# Patient Record
Sex: Female | Born: 1980 | Race: Black or African American | Hispanic: No | Marital: Single | Smoking: Former smoker
Health system: Southern US, Community
[De-identification: ages and names within clinical notes are randomized; demographics above are authoritative.]

## PROBLEM LIST (undated history)

## (undated) ENCOUNTER — Inpatient Hospital Stay (HOSPITAL_COMMUNITY): Payer: Self-pay

## (undated) DIAGNOSIS — E669 Obesity, unspecified: Secondary | ICD-10-CM

## (undated) DIAGNOSIS — M1990 Unspecified osteoarthritis, unspecified site: Secondary | ICD-10-CM

## (undated) DIAGNOSIS — A749 Chlamydial infection, unspecified: Secondary | ICD-10-CM

## (undated) DIAGNOSIS — A599 Trichomoniasis, unspecified: Secondary | ICD-10-CM

## (undated) DIAGNOSIS — D72829 Elevated white blood cell count, unspecified: Principal | ICD-10-CM

## (undated) HISTORY — PX: HIP SURGERY: SHX245

## (undated) HISTORY — PX: TUBAL LIGATION: SHX77

## (undated) HISTORY — PX: JOINT REPLACEMENT: SHX530

## (undated) HISTORY — DX: Elevated white blood cell count, unspecified: D72.829

---
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
## 1998-01-13 ENCOUNTER — Inpatient Hospital Stay (HOSPITAL_COMMUNITY): Admission: AD | Admit: 1998-01-13 | Discharge: 1998-01-15 | Payer: Self-pay | Admitting: Obstetrics

## 1998-03-18 HISTORY — PX: HIP SURGERY: SHX245

## 1998-12-06 ENCOUNTER — Inpatient Hospital Stay (HOSPITAL_COMMUNITY): Admission: AD | Admit: 1998-12-06 | Discharge: 1998-12-06 | Payer: Self-pay | Admitting: Obstetrics

## 1999-04-27 ENCOUNTER — Inpatient Hospital Stay (HOSPITAL_COMMUNITY): Admission: AD | Admit: 1999-04-27 | Discharge: 1999-04-27 | Payer: Self-pay | Admitting: *Deleted

## 1999-07-12 ENCOUNTER — Observation Stay (HOSPITAL_COMMUNITY): Admission: RE | Admit: 1999-07-12 | Discharge: 1999-07-13 | Payer: Self-pay | Admitting: *Deleted

## 1999-08-02 ENCOUNTER — Encounter: Admission: RE | Admit: 1999-08-02 | Discharge: 1999-08-02 | Payer: Self-pay | Admitting: Obstetrics

## 1999-08-16 ENCOUNTER — Encounter: Admission: RE | Admit: 1999-08-16 | Discharge: 1999-08-16 | Payer: Self-pay | Admitting: Obstetrics

## 1999-08-30 ENCOUNTER — Inpatient Hospital Stay (HOSPITAL_COMMUNITY): Admission: AD | Admit: 1999-08-30 | Discharge: 1999-08-30 | Payer: Self-pay | Admitting: *Deleted

## 1999-08-30 ENCOUNTER — Encounter: Admission: RE | Admit: 1999-08-30 | Discharge: 1999-08-30 | Payer: Self-pay | Admitting: Obstetrics

## 1999-09-06 ENCOUNTER — Encounter: Admission: RE | Admit: 1999-09-06 | Discharge: 1999-09-06 | Payer: Self-pay | Admitting: Obstetrics

## 1999-09-13 ENCOUNTER — Encounter: Admission: RE | Admit: 1999-09-13 | Discharge: 1999-09-13 | Payer: Self-pay | Admitting: Obstetrics

## 1999-09-15 ENCOUNTER — Inpatient Hospital Stay (HOSPITAL_COMMUNITY): Admission: AD | Admit: 1999-09-15 | Discharge: 1999-09-17 | Payer: Self-pay | Admitting: *Deleted

## 2001-09-08 ENCOUNTER — Emergency Department (HOSPITAL_COMMUNITY): Admission: EM | Admit: 2001-09-08 | Discharge: 2001-09-08 | Payer: Self-pay | Admitting: Emergency Medicine

## 2002-02-15 ENCOUNTER — Inpatient Hospital Stay (HOSPITAL_COMMUNITY): Admission: AD | Admit: 2002-02-15 | Discharge: 2002-02-15 | Payer: Self-pay | Admitting: *Deleted

## 2002-03-04 ENCOUNTER — Ambulatory Visit (HOSPITAL_COMMUNITY): Admission: RE | Admit: 2002-03-04 | Discharge: 2002-03-04 | Payer: Self-pay | Admitting: *Deleted

## 2002-04-20 ENCOUNTER — Ambulatory Visit (HOSPITAL_COMMUNITY): Admission: RE | Admit: 2002-04-20 | Discharge: 2002-04-20 | Payer: Self-pay | Admitting: *Deleted

## 2002-04-20 ENCOUNTER — Encounter: Payer: Self-pay | Admitting: *Deleted

## 2002-04-26 ENCOUNTER — Encounter: Payer: Self-pay | Admitting: *Deleted

## 2002-04-26 ENCOUNTER — Ambulatory Visit (HOSPITAL_COMMUNITY): Admission: RE | Admit: 2002-04-26 | Discharge: 2002-04-26 | Payer: Self-pay | Admitting: *Deleted

## 2002-07-06 ENCOUNTER — Ambulatory Visit (HOSPITAL_COMMUNITY): Admission: RE | Admit: 2002-07-06 | Discharge: 2002-07-06 | Payer: Self-pay | Admitting: *Deleted

## 2002-07-17 ENCOUNTER — Inpatient Hospital Stay (HOSPITAL_COMMUNITY): Admission: AD | Admit: 2002-07-17 | Discharge: 2002-07-17 | Payer: Self-pay | Admitting: *Deleted

## 2002-08-17 ENCOUNTER — Inpatient Hospital Stay (HOSPITAL_COMMUNITY): Admission: AD | Admit: 2002-08-17 | Discharge: 2002-08-20 | Payer: Self-pay | Admitting: *Deleted

## 2002-08-17 ENCOUNTER — Encounter (INDEPENDENT_AMBULATORY_CARE_PROVIDER_SITE_OTHER): Payer: Self-pay

## 2002-10-12 ENCOUNTER — Emergency Department (HOSPITAL_COMMUNITY): Admission: EM | Admit: 2002-10-12 | Discharge: 2002-10-12 | Payer: Self-pay | Admitting: Emergency Medicine

## 2003-02-02 ENCOUNTER — Emergency Department (HOSPITAL_COMMUNITY): Admission: EM | Admit: 2003-02-02 | Discharge: 2003-02-02 | Payer: Self-pay | Admitting: Emergency Medicine

## 2003-04-25 ENCOUNTER — Inpatient Hospital Stay (HOSPITAL_COMMUNITY): Admission: RE | Admit: 2003-04-25 | Discharge: 2003-04-29 | Payer: Self-pay | Admitting: Orthopedic Surgery

## 2003-05-02 ENCOUNTER — Ambulatory Visit (HOSPITAL_COMMUNITY): Admission: RE | Admit: 2003-05-02 | Discharge: 2003-05-02 | Payer: Self-pay

## 2003-05-23 ENCOUNTER — Encounter: Admission: RE | Admit: 2003-05-23 | Discharge: 2003-08-01 | Payer: Self-pay | Admitting: Orthopedic Surgery

## 2003-11-29 ENCOUNTER — Emergency Department (HOSPITAL_COMMUNITY): Admission: EM | Admit: 2003-11-29 | Discharge: 2003-11-29 | Payer: Self-pay | Admitting: *Deleted

## 2004-07-07 ENCOUNTER — Emergency Department (HOSPITAL_COMMUNITY): Admission: EM | Admit: 2004-07-07 | Discharge: 2004-07-07 | Payer: Self-pay | Admitting: *Deleted

## 2004-07-12 ENCOUNTER — Inpatient Hospital Stay (HOSPITAL_COMMUNITY): Admission: AD | Admit: 2004-07-12 | Discharge: 2004-07-12 | Payer: Self-pay | Admitting: Obstetrics & Gynecology

## 2005-05-21 ENCOUNTER — Emergency Department (HOSPITAL_COMMUNITY): Admission: EM | Admit: 2005-05-21 | Discharge: 2005-05-21 | Payer: Self-pay | Admitting: Family Medicine

## 2005-07-31 ENCOUNTER — Inpatient Hospital Stay (HOSPITAL_COMMUNITY): Admission: AD | Admit: 2005-07-31 | Discharge: 2005-07-31 | Payer: Self-pay | Admitting: Gynecology

## 2005-08-07 ENCOUNTER — Inpatient Hospital Stay (HOSPITAL_COMMUNITY): Admission: RE | Admit: 2005-08-07 | Discharge: 2005-08-07 | Payer: Self-pay | Admitting: Gynecology

## 2005-08-12 ENCOUNTER — Inpatient Hospital Stay (HOSPITAL_COMMUNITY): Admission: AD | Admit: 2005-08-12 | Discharge: 2005-08-12 | Payer: Self-pay | Admitting: Gynecology

## 2005-08-29 ENCOUNTER — Encounter: Payer: Self-pay | Admitting: Obstetrics and Gynecology

## 2005-08-29 ENCOUNTER — Ambulatory Visit: Payer: Self-pay | Admitting: Obstetrics and Gynecology

## 2006-10-15 ENCOUNTER — Inpatient Hospital Stay (HOSPITAL_COMMUNITY): Admission: AD | Admit: 2006-10-15 | Discharge: 2006-10-15 | Payer: Self-pay | Admitting: Gynecology

## 2007-01-28 ENCOUNTER — Inpatient Hospital Stay (HOSPITAL_COMMUNITY): Admission: AD | Admit: 2007-01-28 | Discharge: 2007-01-29 | Payer: Self-pay | Admitting: Gynecology

## 2007-01-31 ENCOUNTER — Inpatient Hospital Stay (HOSPITAL_COMMUNITY): Admission: AD | Admit: 2007-01-31 | Discharge: 2007-01-31 | Payer: Self-pay | Admitting: Gynecology

## 2007-03-13 ENCOUNTER — Other Ambulatory Visit: Payer: Self-pay | Admitting: Emergency Medicine

## 2007-03-14 ENCOUNTER — Inpatient Hospital Stay (HOSPITAL_COMMUNITY): Admission: AD | Admit: 2007-03-14 | Discharge: 2007-03-14 | Payer: Self-pay | Admitting: Obstetrics

## 2007-04-03 ENCOUNTER — Inpatient Hospital Stay (HOSPITAL_COMMUNITY): Admission: AD | Admit: 2007-04-03 | Discharge: 2007-04-04 | Payer: Self-pay | Admitting: Obstetrics

## 2007-07-19 ENCOUNTER — Inpatient Hospital Stay (HOSPITAL_COMMUNITY): Admission: AD | Admit: 2007-07-19 | Discharge: 2007-07-19 | Payer: Self-pay | Admitting: Obstetrics

## 2007-08-02 ENCOUNTER — Inpatient Hospital Stay (HOSPITAL_COMMUNITY): Admission: AD | Admit: 2007-08-02 | Discharge: 2007-08-02 | Payer: Self-pay | Admitting: Obstetrics

## 2007-08-21 ENCOUNTER — Inpatient Hospital Stay (HOSPITAL_COMMUNITY): Admission: AD | Admit: 2007-08-21 | Discharge: 2007-08-21 | Payer: Self-pay | Admitting: Obstetrics

## 2007-09-06 ENCOUNTER — Inpatient Hospital Stay (HOSPITAL_COMMUNITY): Admission: AD | Admit: 2007-09-06 | Discharge: 2007-09-06 | Payer: Self-pay | Admitting: Obstetrics

## 2007-09-07 ENCOUNTER — Inpatient Hospital Stay (HOSPITAL_COMMUNITY): Admission: AD | Admit: 2007-09-07 | Discharge: 2007-09-09 | Payer: Self-pay | Admitting: Obstetrics

## 2007-09-08 ENCOUNTER — Encounter (INDEPENDENT_AMBULATORY_CARE_PROVIDER_SITE_OTHER): Payer: Self-pay | Admitting: Obstetrics

## 2008-11-22 ENCOUNTER — Emergency Department (HOSPITAL_COMMUNITY): Admission: EM | Admit: 2008-11-22 | Discharge: 2008-11-22 | Payer: Self-pay | Admitting: Emergency Medicine

## 2010-06-11 ENCOUNTER — Emergency Department (HOSPITAL_COMMUNITY)
Admission: EM | Admit: 2010-06-11 | Discharge: 2010-06-12 | Disposition: A | Discharge status: Home / Self Care | Payer: Medicaid Other | Attending: Emergency Medicine | Admitting: Emergency Medicine

## 2010-06-11 DIAGNOSIS — Z96649 Presence of unspecified artificial hip joint: Secondary | ICD-10-CM | POA: Insufficient documentation

## 2010-06-11 DIAGNOSIS — M25569 Pain in unspecified knee: Secondary | ICD-10-CM | POA: Insufficient documentation

## 2010-06-12 ENCOUNTER — Emergency Department (HOSPITAL_COMMUNITY): Payer: Medicaid Other

## 2010-07-31 NOTE — Op Note (Signed)
NAMEXOCHILT, CONANT               ACCOUNT NO.:  [id number]   MEDICAL RECORD NO.:  [id number]           PATIENT TYPE:   LOCATION:                                 FACILITY:   PHYSICIAN:  Kathreen Cosier, M.D.DATE OF BIRTH:  [birth year]   DATE OF PROCEDURE:  DATE OF DISCHARGE:                               OPERATIVE REPORT   PREOP DIAGNOSIS:  Multiparity.   POSTOP DIAGNOSIS:  Multiparity.   PROCEDURE:  Postpartum tubal ligation procedure.   PROCEDURE:  The patient placed on the operating table in supine  position.  General anesthesia administered.  Abdomen prepped and draped.  Bladder emptied with Foley straight catheter.  Midline subumbilical  incision made, carried down into the fascia.  Fascia cleaned and incised  the length of the incision.  The fascia and the peritoneum with the Mayo  scissors.  Left tube grasped in midportion with a Babcock clamp, 0 plain  suture placed in the mesosalpinx below the portion of tube within the  clamp.  This was tied and approximately 1-inch of tube transected.  Procedure done in a similar fashion the other side.  Lap and sponge  counts correct.  Abdomen closed in layers.  Peritoneum and fascia  continuous suture with Dexon.  The skin closed with subcuticular stitch  of 4-0 Monocryl.           ______________________________  Kathreen Cosier, M.D.     BAM/MEDQ  D:  09/08/2007  T:  09/09/2007  Job:  [id number]

## 2010-08-03 NOTE — Discharge Summary (Signed)
[facility]  Patient:    Dana Cole, Dana Cole                      MRN: [id number] Adm. Date:  [id number] Disc. Date: [id number] Attending:  Michaelle Copas Dictator:   OB/GYN resident CC:         [facility]                           Discharge Summary  DATE OF BIRTH:                1980-08-27  PRINCIPAL DIAGNOSIS:          Intrauterine pregnancy, at 28-6/[redacted] weeks gestation with a shortened cervix to 1.8 cm by transvaginal ultrasound.  SECONDARY DIAGNOSES:          Urinary tract infection and Trichomonas.  OPERATIONS:                  None.  PROCEDURES:                   X-rays:  The patient underwent OB ultrasound that showed a single cephalic intrauterine pregnancy with an anterior grade 1 placenta.  Amniotic fluid was within normal limits with a total AFI of 9.5. Estimated gestational age [redacted] weeks 6 days.  Cervical length 1.8 cm by transvaginal ultrasound.  LABORATORY EVALUATION:        Wet prep showed many Trichomonas, too numerous to count white cells and bacteria.  Group B strep culture is pending. Chlamydia and gonorrhea are pending.  Urinalysis, moderate amount of leukocyte esterase, 40 ketones, few epithelial cells, few bacteria and hyaline cast.  HOSPITAL COURSE:              Dana Cole is a pleasant 30 year old, G2, P1-0-0-1, at 28-6/[redacted] weeks gestation by 18-week ultrasound, who presented yesterday for routine OB ultrasound and was noted to have a shortened cervix at 1.8 cm.  She was transferred to triage and evaluated there and it was decided that she would benefit from admission.  She had a urinalysis that was suspicious for urinary tract infection, cultures pending at this time.  It was also noted on wet prep to have many bacteria, white cells and Trichomonas. She was admitted to antenatal, placed on continuous fetal and uterine monitoring and was given Macrobid and Flagyl for treatment of her urinary tract infection and  Trichomonas.  On monitoring, she was noted to have no uterine contraction and the fetal heart rate was reassuring.  Overnight she had no subjective uterine contraction and on repeat cervical examination by Dr. Wilma Flavin, who had examined her down in triage, she was noted to have no change in her cervix.  It was felt that she could be discharged home with strict bed rest with close followup at the [facility].  DISCHARGE MEDICATIONS:        1. Flagyl 500 mg one p.o. b.i.d. for an                                  additional six days.                               2. Macrobid 100 mg one p.o. b.i.d. for an  additional six days.                               3. Prenatal vitamins one p.o. q.d.  DISPOSITION:                  She will be discharged to home, will be on bed rest with bathroom privileges and may be up to eat.  She will be given preterm labor precautions and will have a followup appointment next week at the [facility] clinic.  She was instructed on the pelvic rest as well. DD:  07/13/99 TD:  07/18/99 Job: [id number]

## 2010-08-03 NOTE — H&P (Signed)
NAMEVIVIENNE, Cole                         ACCOUNT NO.:  [id number]   MEDICAL RECORD NO.:  [id number]                   PATIENT TYPE:  INP   LOCATION:  NA                                   FACILITY:  [facility]   PHYSICIAN:  Mila Homer. Sherlean Foot, M.D.              DATE OF BIRTH:  1981-02-21   DATE OF ADMISSION:  04/25/2003  DATE OF DISCHARGE:                                HISTORY & PHYSICAL   CHIEF COMPLAINT:  Left hip pain.   HISTORY OF PRESENT ILLNESS:  Dana Cole is a 30 year old African-American  female with left hip pain since 1990.  The patient underwent pinning of both  hips plus left capital femoral sepsis in the early 1990s.  Right hip is  nonpainful.  Left hip is painful since first surgery and remained painful  despite one or two more surgeries.  The patient did undergo removal of the  pins in the left hip.  Her pain in the left hip has became severe and  constant since November 2004.  Pain begins in the left hip and radiates down  to her knee.  She describes the pain as a throbbing constant pain that is  better with sitting.  Pain is worse with walking long distances and when she  arises from a chair or a sitting position after sitting for a long period of  time.  She takes Tylenol and Motrin for pain with no relief.  The pain does  awake her at night.  The patient uses no assistive devices to ambulate.  The  patient is to undergo left total hip arthroplasty on April 25, 2003, for  severe arthritis of the left hip.   ALLERGIES:  No known drug allergies.  She is allergic to [facility].   MEDICATIONS:  None.   PAST MEDICAL HISTORY:  Negative.   REVIEW OF SYSTEMS:  Negative for chest pain, PND, orthopnea, or shortness of  breath with exertion.  She does state that she has had a cough for  approximately a week, however, she has been afebrile.  Otherwise, the  patient denies any GI, GU, hematologic, or neurologic medical conditions.  She does have nocturia two to three times a  night, and has had nocturia  since giving birth to her last child.   PAST SURGICAL HISTORY:  1. Bilateral hips pinnings for slipped capital femoral epiphysis done in     early 1990s.  2. Multiple surgeries on the left hip, including removal of the left hip     pins.  The patient denies any complications or receiving any blood products with  any of the hip surgeries.   SOCIAL HISTORY:  The patient smokes 1/2 pack of cigarettes a day and has  done so for seven years.  Has an occasional alcoholic beverage.  She is  single.  She has three small children, ages 66, 34, and 7.  She is seen for  her  health care at [facility].  The patient lives in a one story home  with 0 steps to the usual entrance.  She works as a Conservation officer, nature at [employer].   FAMILY HISTORY:  Mother is alive at age 23 with no health problems.  The  patient's father is alive at age 21 with no health problems.  She has two  brothers, one 3 months and one 6 years old with no health problems.  A  sister who is 23 years old and has no known health problems.  The rest of her  family history reveals that her maternal grandmother had diabetes and  hypertension.  Otherwise, family history is none.   PHYSICAL EXAMINATION:  GENERAL:  The patient is a well-developed, well-  nourished overweight female who walks with an antalgic gait favoring the  left hip.  The patient's mood and affect are appropriate.  She talks easily  with examiner.  VITAL SIGNS:  The patient's height is 5 feet 10 inches, weight is 251  pounds.  Blood pressure is 110/78, pulse 64, respiratory rate 16,  temperature is 98.1.  CARDIAC:  Regular rate and rhythm, no murmurs, rubs, or gallops noted.  RESPIRATORY:  Lungs are clear to auscultation bilaterally with no wheezes,  rhonchi, or rales noted.  ABDOMEN:  Obese, soft, nontender, bowel sounds x4 quadrants.  BREASTS:  Deferred.  GENITOURINARY:  Deferred.  RECTAL:  Deferred.  HEENT:  Normocephalic,  atraumatic without frontal or maxillary sinus  tenderness to palpation.  Conjunctivae are pink.  Sclerae are nonicteric.  PERRLA.  EOMs are intact.  No visible external ear deformities are noted.  TMs are occluded due to heavy cerumen.  Nose:  Nasal septum midline and  nasal mucosa pink, moist, and without polyps.  Buccal mucosa is pink and  moist.  Good dentition.  Pharynx without erythema or exudate.  Tongue:  The  patient has a tongue piercing.  Otherwise, tongue and uvula are midline.  NECK:  Trachea midline.  No lymphadenopathy.  Carotids are 2+ and without  bruits.  The patient has no tenderness along the cervical spine and has full  range of motion of the cervical spine.  NEUROLOGIC:  Alert and oriented x3.  Strength testing of the upper and lower  extremities reveals 5/5 strength bilaterally.  Deep tendon reflexes are  equal and symmetric in the upper and lower extremities.  Cranial nerves II-  XII are grossly intact.  MUSCULOSKELETAL:  Upper extremities are equal in size and shape.  She has  full range of motion of the shoulders, elbows, wrists, and hands  bilaterally.  Radial pulses are 2+ bilaterally.  Right hip:  Full range of  motion.  Left hip:  Approximately 15 to 20 degrees internal rotation, 30 to  40 degrees of external rotation, flexion with the hip is painful.  Otherwise, the lower extremities are neuro and motor vascularly intact.   LABORATORY DATA:  X-rays of the left hip shows severe osteoarthritis.  Right  hip shows a single pin to be in place with no arthritic changes.   IMPRESSION:  1. Left hip severe osteoarthritis, status post slipped capital femoral     epiphysis with pin removal.  2. Right hip, status post pinning of the slipped capital femoral epiphysis.   PLAN:  The patient is to be admitted to [facility] on April 25, 2003, and undergo a left total hip arthroplasty due to severe osteoarthritis of the left hip.  The patient  is to undergo all  routine testing and  laboratory work prior to this surgical procedure.      Dana Cole, P.A.                       Mila Homer. Sherlean Foot, M.D.    GC/MEDQ  D:  04/18/2003  T:  04/18/2003  Job:  [id number]

## 2010-08-03 NOTE — Op Note (Signed)
NAMETENSLEY, WERY                         ACCOUNT NO.:  [id number]   MEDICAL RECORD NO.:  [id number]                   PATIENT TYPE:  INP   LOCATION:  5006                                 FACILITY:  [facility]   PHYSICIAN:  Mila Homer. Sherlean Foot, M.D.              DATE OF BIRTH:  12/14/1980   DATE OF PROCEDURE:  04/25/2003  DATE OF DISCHARGE:                                 OPERATIVE REPORT   PREOPERATIVE DIAGNOSIS:  Left hip osteoarthritis.   POSTOPERATIVE DIAGNOSIS:  Left hip osteoarthritis.   PROCEDURE:  Left total hip replacement.   SURGEON:  Mila Homer. Sherlean Foot, M.D.   ASSISTANT:  Richardean Canal, P.A.   ANESTHESIA:  General.   INDICATIONS FOR PROCEDURE:  The patient is a 30 year old black female,  status post a slipped capital femoral epiphysis when she was a child,  resulting in severe osteoarthritis of the hip.  Conservative measures  failed.  Informed consent was obtained.   DESCRIPTION OF PROCEDURE:  The patient was lain supine and administered  general anesthesia and Foley catheter placement. She was then placed in the  right and left lateral decubitus position.  The left hip was prepped and  draped in the usual sterile fashion.  The proximal two-thirds of the old  incision was used and then brought more posteriorly and then extended  approximately another four inches for a total length of approximately 8-10  inches.  This was made with a #10 blade.  Cautery dissection was used to go  down to the level of the fascia lata.  Cautery was used to cauterize  bleeding vessels.  I then incised the fascia lata along the length of the  incision and held it in place with a Charnley retractor.  I then elevated  the anterior one-half of the vastus lateralis and a single sleeve with the  gluteus medius and minimus.  We took the anterior one-half of the gluteus  medius and left the rest attached to the trochanter.  I placed first stay  sutures in this sleeve and performed an anterior hip  capsulectomy.  I  dislocated the hip without too much difficulty.  I then cut the femoral neck  and head up using the cutting template and a sagittal saw.  I then placed a  Holmium anterior and posterior to the acetabulum and cleaned out the labrum  and soft tissue from within the acetabulum.  I then sequentially reamed up  to a 52 mm reamer and placed in a three-spiked, fully fiber meshed  acetabulum.  At this point, I then switched sides of the table with my P.A.,  so I was back on the posterior side of the patient. We gained access to the  intramedullary canal with the canal finder and then a side-biting reamer to  ream into the trochanter.  I then reamed up to 13 mm and broached a size 11,  size 12, and then  size 13.  I then did a reduction with a +35 head with a 32  ball, accepting into a standard acetabular poly trial.  This afforded  excellent stability, so I then removed the trial components and tamped in a  real polyethylene liner.  I then tamped down the real size 13 fully porous-  coated stem and the +3.5 mm, 32 mm in diameter ball.  I took it through an  aggressive range of motion.  It was very stable.  I then copiously  irrigated.  I then closed the vastus lateralis medius down the sleeve,  through drill holes in the trochanter and then oversewed the interval with  multiple figure-of-eight #2 Ethibond sutures.  I then closed the fascia lata  with interrupted #1 Vicryl figure-of-eight sutures, the deep soft tissues  with interrupted 0 Vicryl sutures, and then a subcuticular running 2-0  Vicryl stitch, and skin staples.  I dressed it with Adaptic, 4 x 4s, sterile  ABDs, and Hypofix tape.   COMPLICATIONS:  None.   DRAINS:  None.   ESTIMATED BLOOD LOSS:  300 mL.                                               Mila Homer. Sherlean Foot, M.D.    SDL/MEDQ  D:  04/25/2003  T:  04/25/2003  Job:  [id number]

## 2010-08-03 NOTE — Discharge Summary (Signed)
NAMEFINNLEY, LEWIS                         ACCOUNT NO.:  [id number]   MEDICAL RECORD NO.:  [id number]                   PATIENT TYPE:  INP   LOCATION:  5006                                 FACILITY:  [facility]   PHYSICIAN:  Mila Homer. Sherlean Foot, M.D.              DATE OF BIRTH:  1980-12-03   DATE OF ADMISSION:  04/25/2003  DATE OF DISCHARGE:  04/29/2003                                 DISCHARGE SUMMARY   ADMISSION DIAGNOSES:  1. Left hip osteoarthritis status post slipped capital femoral epiphysis     with pin removal.  2. Right hip status post pinning of slipped capital femoral epiphysis.   DISCHARGE DIAGNOSES:  1. Status post left total hip replacement.  2. Acute blood loss anemia secondary to surgery.  3. Hypokalemia status post surgery.  4. History of right hip slipped capital femoral epiphysis status post     pinning.   HISTORY OF PRESENT ILLNESS:  Disney is a 30 year old African-American  female with left hip pain since approximately 1990. The patient underwent  pinning of both hips for slipped capital femoral epiphysis in the early 90s.  The right hip is not painful. The left hip pain since the first surgery and  remained painful despite one or two more surgeries and eventual removal of  the left hip pin. The pain gradually became more severe and constant. The  patient states pain begins in the left hip and radiates to the left knee.  Pain is describes as a throbbing constant pain that is better with sitting.  Pain is worse with walking long distances. She takes Tylenol and Motrin for  the pain with no relief. Pain does awaken her at night. The patient was  admitted to the hospital on April 25, 2003 to undergo left total hip. X-  rays of the left hip show severe arthritis.   ALLERGIES:  No known drug allergies. Has had a SHELLFISH allergy.   MEDICATIONS:  None.   SURGICAL PROCEDURES:  The patient was taken to the operating room on  April 25, 2003 by Dr. Sherlean Foot assisted  by Rexene Edison, P.A. The patient was  placed under general anesthesia, and a left total hip replacement was  performed. The following components were used:  Three ___________ fiber mesh  to acetabulum, size 13 fully porous coated stem, +3.5-mm, 32 mm in diameter  ball. The patient tolerated the procedure well and returned to the recovery  room in stable condition.   CONSULTS:  The following consults were obtained while patient was  hospitalization:  Physical therapy and case management.   HOSPITAL COURSE:  The patient developed acute blood loss anemia secondary to  surgery, however, remained asymptomatic and did not require any blood  transfusion. H and H on postoperative day #3 was 11.2/32.4. The patient  developed postoperative hypokalemia, and this was treated. The patient  progressed slowly with physical therapy, and due to social issues was  discharged home postoperative day #4 in good and stable condition.   LABORATORY DATA:  Routine labs on admission:  CBC:  White blood count 5.8,  hemoglobin 14.3, hematocrit 41.1, platelets 227.   Coags on admission:  PT 13, INR 1.0, PTT 28. Routine chemistries:  Sodium  140, potassium 3.5, chloride 107, bicarb 29, glucose 95, BUN 7, creatinine  0.8.   Hepatic enzymes on admission:  AST 22, ALT 40, ALP 63, total bilirubin was  1.6. UA on admission:  Bilirubin small, leukocyte esterase moderate,  epithelial cells few, hyaline casts were present. Repeat urinalysis on  April 26, 2003 was negative.   Postoperative AP pelvis and left hip showed the prosthesis to be in  satisfactory position.   DISCHARGE MEDICATIONS:  1. Lovenox 40 mg one injection subcu for 10 days each morning at 8 a.m.  2. Potassium chloride 40 mEq one tablet q.d. for 7 days.  3. OxyContin 20 mg one tablet q.12h. p.r.n. pain.  4. Percocet one to two tablets every four to six hours for breakthrough     pain.   ACTIVITY:  The patient is weight bearing as tolerated with  walker, physical  therapy, and home health [facility].   DIET:  No restrictions.   WOUND CARE:  The patient is to keep wound clean and dry. Change dressing  daily. May shower after two days of no drainage. The patient to call office  if she develops any of the following:  Temperature greater than 100.5,  chills, swelling, foul-smelling drainage, or pain not well controlled with  pain medications.   FOLLOW UP:  The patient will need a followup visit with Dr. Sherlean Foot in  approximately 10 days from discharge. The patient is to call office at [phone] to make an appointment.      Richardean Canal, P.A.                       Mila Homer. Sherlean Foot, M.D.    GC/MEDQ  D:  05/25/2003  T:  05/26/2003  Job:  [id number]

## 2010-12-07 LAB — URINALYSIS, ROUTINE W REFLEX MICROSCOPIC
Bilirubin Urine: NEGATIVE
Specific Gravity, Urine: <1.005 — ABNORMAL LOW
Urobilinogen, UA: 0.2

## 2010-12-07 LAB — GC/CHLAMYDIA PROBE AMP, GENITAL
Chlamydia, DNA Probe: NEGATIVE
GC Probe Amp, Genital: NEGATIVE

## 2010-12-07 LAB — URINE MICROSCOPIC-ADD ON

## 2010-12-07 LAB — WET PREP, GENITAL: Yeast Wet Prep HPF POC: NONE SEEN

## 2010-12-12 LAB — URINALYSIS, ROUTINE W REFLEX MICROSCOPIC: Ketones, ur: 15 — AB

## 2010-12-13 LAB — CBC
HCT: 36.4
Hemoglobin: 11.6 — ABNORMAL LOW
Hemoglobin: 12.5
MCHC: 34.5
MCHC: 34.7
MCV: 89.5
Platelets: 193
RDW: 14.7
WBC: 10.8 — ABNORMAL HIGH

## 2010-12-13 LAB — WET PREP, GENITAL: Trich, Wet Prep: NONE SEEN

## 2010-12-21 LAB — URINALYSIS, ROUTINE W REFLEX MICROSCOPIC
Bilirubin Urine: NEGATIVE
Ketones, ur: NEGATIVE
Protein, ur: NEGATIVE

## 2010-12-21 LAB — I-STAT 8, (EC8 V) (CONVERTED LAB)
Acid-base deficit: 4 — ABNORMAL HIGH
Glucose, Bld: 115 — ABNORMAL HIGH
HCT: 38
pCO2, Ven: 33.2 — ABNORMAL LOW
pH, Ven: 7.398 — ABNORMAL HIGH

## 2010-12-21 LAB — POCT I-STAT CREATININE
Creatinine, Ser: 0.8
Operator id: 133351

## 2010-12-21 LAB — URINE MICROSCOPIC-ADD ON

## 2010-12-21 LAB — CBC
MCHC: 34.2
MCV: 88.1
RBC: 4.02
RDW: 13.2
WBC: 10.8 — ABNORMAL HIGH

## 2010-12-21 LAB — DIFFERENTIAL
Eosinophils Relative: 1
Monocytes Relative: 5

## 2010-12-25 LAB — URINALYSIS, ROUTINE W REFLEX MICROSCOPIC
Bilirubin Urine: NEGATIVE
Glucose, UA: NEGATIVE
Ketones, ur: NEGATIVE
Ketones, ur: NEGATIVE
Nitrite: NEGATIVE
Protein, ur: NEGATIVE
Specific Gravity, Urine: 1.01
Urobilinogen, UA: 0.2
Urobilinogen, UA: 0.2

## 2010-12-25 LAB — WET PREP, GENITAL

## 2010-12-25 LAB — POCT PREGNANCY, URINE: Operator id: 12087

## 2010-12-25 LAB — GC/CHLAMYDIA PROBE AMP, GENITAL: Chlamydia, DNA Probe: NEGATIVE

## 2010-12-25 LAB — URINE MICROSCOPIC-ADD ON

## 2010-12-25 LAB — HCG, QUANTITATIVE, PREGNANCY: hCG, Beta Chain, Quant, S: 7075 — ABNORMAL HIGH

## 2010-12-25 LAB — URINE CULTURE

## 2010-12-31 LAB — URINALYSIS, ROUTINE W REFLEX MICROSCOPIC
Leukocytes, UA: NEGATIVE
Nitrite: NEGATIVE
Specific Gravity, Urine: 1.02
Urobilinogen, UA: 1

## 2010-12-31 LAB — URINE MICROSCOPIC-ADD ON

## 2010-12-31 LAB — WET PREP, GENITAL

## 2010-12-31 LAB — CBC
Hemoglobin: 12.7
RBC: 4.35
RDW: 13

## 2010-12-31 LAB — GC/CHLAMYDIA PROBE AMP, GENITAL: Chlamydia, DNA Probe: NEGATIVE

## 2011-04-09 ENCOUNTER — Encounter (HOSPITAL_COMMUNITY): Payer: Self-pay | Admitting: Emergency Medicine

## 2011-04-09 ENCOUNTER — Emergency Department (HOSPITAL_COMMUNITY)
Admission: EM | Admit: 2011-04-09 | Discharge: 2011-04-10 | Disposition: A | Discharge status: Home / Self Care | Payer: Medicaid Other | Attending: Emergency Medicine | Admitting: Emergency Medicine

## 2011-04-09 DIAGNOSIS — R51 Headache: Secondary | ICD-10-CM | POA: Insufficient documentation

## 2011-04-09 DIAGNOSIS — R1110 Vomiting, unspecified: Secondary | ICD-10-CM | POA: Insufficient documentation

## 2011-04-09 MED ORDER — KETOROLAC TROMETHAMINE 30 MG/ML IJ SOLN
30.0000 mg | Freq: Once | INTRAMUSCULAR | Status: AC
  Administered 2011-04-09: 30 mg via INTRAMUSCULAR
  Filled 2011-04-09: qty 1

## 2011-04-09 MED ORDER — DIPHENHYDRAMINE HCL 25 MG PO CAPS
25.0000 mg | ORAL_CAPSULE | Freq: Once | ORAL | Status: AC
  Administered 2011-04-09: 25 mg via ORAL
  Filled 2011-04-09: qty 1

## 2011-04-09 MED ORDER — METOCLOPRAMIDE HCL 5 MG/ML IJ SOLN
10.0000 mg | Freq: Once | INTRAMUSCULAR | Status: AC
  Administered 2011-04-09: 10 mg via INTRAMUSCULAR
  Filled 2011-04-09: qty 2

## 2011-04-09 NOTE — ED Provider Notes (Signed)
History     CSN: [id number]  Arrival date & time 04/09/11  2011   First MD Initiated Contact with Patient 04/09/11 2246      Chief Complaint  Patient presents with  . Migraine     HPI  History provided by the patient. Patient is 31 year old female with no significant past medical history presents with complaints of generalized headache that has gradually increased for the past one and half days. Pain is described as dull and throbbing at times. Pain has gradually increased. Pain is similar to previous headaches the patient reports it is lasting longer. Patient has not taken any medications for her symptoms. Pain is worse with movements and bright lights. Patient also reports having one episode of vomiting today. Patient otherwise reports normal appetite. Patient denies any recent URI symptoms. No fever no chills no sweats. Patient has no other significant past medical history.    History reviewed. No pertinent past medical history.  Past Surgical History  Procedure Date  . Joint replacement     Family History  Problem Relation Age of Onset  . Hypertension Other   . Diabetes Other     History  Substance Use Topics  . Smoking status: Current Everyday Smoker    Types: Cigarettes  . Smokeless tobacco: Not on file  . Alcohol Use: Yes     occassional     OB History    Grav Para Term Preterm Abortions TAB SAB Ect Mult Living                  Review of Systems  Constitutional: Negative for fever, chills and appetite change.  Respiratory: Negative for cough.   Cardiovascular: Negative for chest pain.  Gastrointestinal: Positive for vomiting. Negative for nausea, abdominal pain, diarrhea and constipation.  Neurological: Positive for headaches. Negative for dizziness and numbness.  All other systems reviewed and are negative.    Allergies  Shellfish allergy  Home Medications  No current outpatient prescriptions on file.  BP 134/83  Pulse 77  Temp(Src) 98.4 F  (36.9 C) (Oral)  Resp 18  Wt 300 lb (136.079 kg)  SpO2 100%  LMP 03/24/2011  Physical Exam  Nursing note and vitals reviewed. Constitutional: She is oriented to person, place, and time. She appears well-developed and well-nourished. No distress.  HENT:  Head: Normocephalic and atraumatic.  Eyes: Conjunctivae and EOM are normal. Pupils are equal, round, and reactive to light.  Neck: Normal range of motion. Neck supple.       No meningeal signs  Cardiovascular: Normal rate and regular rhythm.   Pulmonary/Chest: Effort normal and breath sounds normal. No respiratory distress.  Abdominal: Soft.  Neurological: She is alert and oriented to person, place, and time. She has normal strength. No cranial nerve deficit or sensory deficit. Coordination and gait normal.  Skin: Skin is warm and dry. No rash noted.  Psychiatric: She has a normal mood and affect. Her behavior is normal.    ED Course  Procedures     1. Headache       MDM  10:45 PM patient seen and evaluated. Patient in no acute distress. Patient is well-appearing. Patient has normal nonfocal neuro exam. Plan to treat symptoms and reassess.   Patient reports feeling much better after medications. Patient requesting to return home at this time.     Angus Seller, [place] 04/10/11 [phone]

## 2011-04-09 NOTE — ED Notes (Signed)
Pt states she has a headache that started yesterday  Pt describes pain as a constant pressure in her head  Pt states has hx of same about a year ago  Pt c/o nausea without vomiting  Pt states she is sensative to light and noise

## 2011-04-09 NOTE — ED Notes (Signed)
Pt sts onset of headache yesterday. Nausea, vomiting, photophobia.

## 2011-04-12 NOTE — ED Provider Notes (Signed)
Medical screening examination/treatment/procedure(s) were performed by non-physician practitioner and as supervising physician I was immediately available for consultation/collaboration. Devoria Albe, MD, Armando Gang   Ward Givens, MD 04/12/11 Ventura Bruns

## 2012-10-08 ENCOUNTER — Encounter (HOSPITAL_COMMUNITY): Payer: Self-pay | Admitting: *Deleted

## 2012-10-08 ENCOUNTER — Emergency Department (HOSPITAL_COMMUNITY)
Admission: EM | Admit: 2012-10-08 | Discharge: 2012-10-08 | Disposition: A | Discharge status: Home / Self Care | Payer: Medicaid Other | Attending: Emergency Medicine | Admitting: Emergency Medicine

## 2012-10-08 DIAGNOSIS — H6091 Unspecified otitis externa, right ear: Secondary | ICD-10-CM

## 2012-10-08 DIAGNOSIS — H938X9 Other specified disorders of ear, unspecified ear: Secondary | ICD-10-CM | POA: Insufficient documentation

## 2012-10-08 DIAGNOSIS — F17200 Nicotine dependence, unspecified, uncomplicated: Secondary | ICD-10-CM | POA: Insufficient documentation

## 2012-10-08 DIAGNOSIS — H6690 Otitis media, unspecified, unspecified ear: Secondary | ICD-10-CM | POA: Insufficient documentation

## 2012-10-08 MED ORDER — HYDROCORTISONE-ACETIC ACID 1-2 % OT SOLN: 4.0000 [drp] | Freq: Three times a day (TID) | OTIC | Status: DC

## 2012-10-08 MED ORDER — NEOMYCIN-POLYMYXIN-HC 3.5-10000-1 OT SUSP: 4.0000 [drp] | Freq: Four times a day (QID) | OTIC | Status: DC

## 2012-10-08 NOTE — ED Notes (Signed)
Pt reports onset of right sided ear pain 2 days ago. Thought she had water in ear, pain got progressively worse.

## 2012-10-08 NOTE — ED Provider Notes (Signed)
Medical screening examination/treatment/procedure(s) were performed by non-physician practitioner and as supervising physician I was immediately available for consultation/collaboration.   Deontay Ladnier, MD 10/08/12 1544 

## 2012-10-08 NOTE — ED Provider Notes (Signed)
History    CSN: [id number] Arrival date & time 10/08/12  1325  First MD Initiated Contact with Patient 10/08/12 1340     No chief complaint on file.  (Consider location/radiation/quality/duration/timing/severity/associated sxs/prior Treatment) Patient is a 32 y.o. female presenting with ear pain.  Otalgia Location:  Right Behind ear:  No abnormality Quality:  Aching and pressure Severity:  Moderate Onset quality:  Gradual Duration:  1 week Timing:  Constant Progression:  Waxing and waning Chronicity:  New Context: water   Context: not direct blow, not elevation change, not foreign body in ear and not loud noise   Relieved by:  Nothing Worsened by:  Nothing tried Ineffective treatments:  None tried Associated symptoms: no abdominal pain, no congestion, no cough, no diarrhea, no ear discharge, no fever, no headaches, no hearing loss, no neck pain, no rash, no rhinorrhea, no sore throat, no tinnitus and no vomiting   Risk factors: no recent travel, no chronic ear infection and no prior ear surgery       No past medical history on file. Past Surgical History  Procedure Laterality Date  . Joint replacement     Family History  Problem Relation Age of Onset  . Hypertension Other   . Diabetes Other    History  Substance Use Topics  . Smoking status: Current Every Day Smoker    Types: Cigarettes  . Smokeless tobacco: Not on file  . Alcohol Use: Yes     Comment: occassional    OB History   Grav Para Term Preterm Abortions TAB SAB Ect Mult Living                 Review of Systems  Constitutional: Negative for fever.  HENT: Positive for ear pain. Negative for hearing loss, congestion, sore throat, rhinorrhea, neck pain, tinnitus and ear discharge.   Respiratory: Negative for cough.   Gastrointestinal: Negative for vomiting, abdominal pain and diarrhea.  Skin: Negative for rash.  Neurological: Negative for headaches.    Allergies  Shellfish allergy  Home  Medications  No current outpatient prescriptions on file. There were no vitals taken for this visit. Physical Exam  Constitutional: She is oriented to person, place, and time. She appears well-developed and well-nourished. No distress.  HENT:  Head: Normocephalic and atraumatic.  Right Ear: Hearing and tympanic membrane normal. There is swelling and tenderness.  Left Ear: Hearing, tympanic membrane, external ear and ear canal normal.  Eyes: Conjunctivae are normal. No scleral icterus.  Neck: Normal range of motion.  Cardiovascular: Normal rate, regular rhythm and normal heart sounds.  Exam reveals no gallop and no friction rub.   No murmur heard. Pulmonary/Chest: Effort normal and breath sounds normal. No respiratory distress.  Abdominal: Soft. Bowel sounds are normal. She exhibits no distension and no mass. There is no tenderness. There is no guarding.  Neurological: She is alert and oriented to person, place, and time.  Skin: Skin is warm and dry. She is not diaphoretic.    ED Course  Procedures (including critical care time) Labs Reviewed - No data to display No results found. 1. Otitis externa of right ear     MDM  2:26 PM Otitis externa  Pt presenting with otitis externa after swimming. No canal occlusion, Pt afebrile in NAD. Exam non concerning for mastoiditis, cellulitis or malignant OE.  Dc with cortisporin script.  Advised pcp follow up in 2-3 days if no improvement with treatment or no complete resolution by 7 days.  Arthor Captain, PA-C 10/08/12 1429

## 2013-07-04 ENCOUNTER — Inpatient Hospital Stay (HOSPITAL_COMMUNITY)
Admission: AD | Admit: 2013-07-04 | Discharge: 2013-07-04 | Disposition: A | Discharge status: Home / Self Care | Payer: Medicaid Other | Source: Ambulatory Visit | Attending: Obstetrics & Gynecology | Admitting: Obstetrics & Gynecology

## 2013-07-04 ENCOUNTER — Encounter (HOSPITAL_COMMUNITY): Payer: Self-pay | Admitting: *Deleted

## 2013-07-04 DIAGNOSIS — Z9851 Tubal ligation status: Secondary | ICD-10-CM | POA: Insufficient documentation

## 2013-07-04 DIAGNOSIS — R109 Unspecified abdominal pain: Secondary | ICD-10-CM | POA: Insufficient documentation

## 2013-07-04 DIAGNOSIS — N949 Unspecified condition associated with female genital organs and menstrual cycle: Secondary | ICD-10-CM | POA: Insufficient documentation

## 2013-07-04 DIAGNOSIS — N390 Urinary tract infection, site not specified: Secondary | ICD-10-CM | POA: Insufficient documentation

## 2013-07-04 DIAGNOSIS — F17200 Nicotine dependence, unspecified, uncomplicated: Secondary | ICD-10-CM | POA: Insufficient documentation

## 2013-07-04 DIAGNOSIS — N760 Acute vaginitis: Secondary | ICD-10-CM | POA: Insufficient documentation

## 2013-07-04 DIAGNOSIS — B9689 Other specified bacterial agents as the cause of diseases classified elsewhere: Secondary | ICD-10-CM | POA: Insufficient documentation

## 2013-07-04 DIAGNOSIS — A499 Bacterial infection, unspecified: Secondary | ICD-10-CM

## 2013-07-04 LAB — URINALYSIS, ROUTINE W REFLEX MICROSCOPIC
Bilirubin Urine: NEGATIVE
Glucose, UA: NEGATIVE mg/dL
Ketones, ur: NEGATIVE mg/dL
NITRITE: POSITIVE — AB
PH: 6 (ref 5.0–8.0)
Protein, ur: NEGATIVE mg/dL
SPECIFIC GRAVITY, URINE: 1.02 (ref 1.005–1.030)
UROBILINOGEN UA: 0.2 mg/dL (ref 0.0–1.0)

## 2013-07-04 LAB — URINE MICROSCOPIC-ADD ON

## 2013-07-04 LAB — WET PREP, GENITAL
TRICH WET PREP: NONE SEEN
YEAST WET PREP: NONE SEEN

## 2013-07-04 LAB — POCT PREGNANCY, URINE: PREG TEST UR: NEGATIVE

## 2013-07-04 MED ORDER — CEPHALEXIN 500 MG PO CAPS: 500.0000 mg | ORAL_CAPSULE | Freq: Three times a day (TID) | ORAL | Status: DC

## 2013-07-04 MED ORDER — METRONIDAZOLE 500 MG PO TABS: 500.0000 mg | ORAL_TABLET | Freq: Two times a day (BID) | ORAL | Status: DC

## 2013-07-04 NOTE — MAU Provider Note (Signed)
History     CSN: [id number]  Arrival date and time: 07/04/13 0109   First Provider Initiated Contact with Patient 07/04/13 0138      Chief Complaint  Patient presents with  . Abdominal Pain  . Pelvic Pain   HPI  Pt is here with report of pelvic pressure and vaginal pain x 4 days.  Pain is described as feeling like "something is stuck" in the vagina.  Pain increases with urination.  No hematuria or abnormal vaginal discharge.  Patient's last menstrual period was 05/30/2013. Family planning method:  Bilateral tubal ligation in 2010.    History reviewed. No pertinent past medical history.  Past Surgical History  Procedure Laterality Date  . Joint replacement    . Hip surgery    . Tubal ligation      Family History  Problem Relation Age of Onset  . Hypertension Other   . Diabetes Other     History  Substance Use Topics  . Smoking status: Current Some Day Smoker    Types: Cigarettes  . Smokeless tobacco: Not on file  . Alcohol Use: Yes     Comment: occassional     Allergies:  Allergies  Allergen Reactions  . Shellfish Allergy Itching and Swelling    Prescriptions prior to admission  Medication Sig Dispense Refill  . cyclobenzaprine (FLEXERIL) 5 MG tablet Take 5 mg by mouth at bedtime.      . diclofenac (VOLTAREN) 75 MG EC tablet Take 75 mg by mouth 2 (two) times daily.      . methocarbamol (ROBAXIN) 750 MG tablet Take 750 mg by mouth 2 (two) times daily as needed for muscle spasms.      Marland Kitchen. acetic acid-hydrocortisone (VOSOL-HC) otic solution Place 4 drops into the right ear 3 (three) times daily.  10 mL  0  . neomycin-polymyxin-hydrocortisone (CORTISPORIN) 3.5-10000-1 otic suspension Place 4 drops in ear(s) 4 (four) times daily. X 7 days  10 mL  0    Review of Systems  Gastrointestinal: Positive for abdominal pain (pelvic pain).  Genitourinary: Positive for dysuria. Negative for hematuria and flank pain.       Vaginal pressure  All other systems reviewed and are  negative.  Physical Exam   Blood pressure 124/68, pulse 98, temperature 98.6 F (37 C), temperature source Oral, resp. rate 20, height 5\' 9"  (1.753 m), weight 130.182 kg (287 lb), last menstrual period 05/30/2013, SpO2 100.00%.  Physical Exam  Constitutional: She is oriented to person, place, and time. She appears well-developed and well-nourished. No distress.  HENT:  Head: Normocephalic.  Neck: Normal range of motion. Neck supple.  Cardiovascular: Normal rate, regular rhythm and normal heart sounds.   Respiratory: Effort normal and breath sounds normal. No respiratory distress.  GI: Soft. There is tenderness (suprapubic).  Genitourinary: Cervix exhibits no motion tenderness and no discharge. Right adnexum displays no mass and no tenderness. Left adnexum displays no mass and no tenderness. No bleeding around the vagina.  Musculoskeletal: Normal range of motion. She exhibits no edema.  Neurological: She is alert and oriented to person, place, and time. She has normal reflexes.  Skin: Skin is warm and dry.    MAU Course  Procedures Results for orders placed during the hospital encounter of 07/04/13 (from the past 24 hour(s))  URINALYSIS, ROUTINE W REFLEX MICROSCOPIC     Status: Abnormal   Collection Time    07/04/13  1:15 AM      Result Value Ref Range   Color,  Urine YELLOW  YELLOW   APPearance CLEAR  CLEAR   Specific Gravity, Urine 1.020  1.005 - 1.030   pH 6.0  5.0 - 8.0   Glucose, UA NEGATIVE  NEGATIVE mg/dL   Hgb urine dipstick TRACE (*) NEGATIVE   Bilirubin Urine NEGATIVE  NEGATIVE   Ketones, ur NEGATIVE  NEGATIVE mg/dL   Protein, ur NEGATIVE  NEGATIVE mg/dL   Urobilinogen, UA 0.2  0.0 - 1.0 mg/dL   Nitrite POSITIVE (*) NEGATIVE   Leukocytes, UA MODERATE (*) NEGATIVE  URINE MICROSCOPIC-ADD ON     Status: Abnormal   Collection Time    07/04/13  1:15 AM      Result Value Ref Range   Squamous Epithelial / LPF FEW (*) RARE   WBC, UA 11-20  <3 WBC/hpf   RBC / HPF 3-6  <3  RBC/hpf   Bacteria, UA MANY (*) RARE  POCT PREGNANCY, URINE     Status: None   Collection Time    07/04/13  1:30 AM      Result Value Ref Range   Preg Test, Ur NEGATIVE  NEGATIVE  WET PREP, GENITAL     Status: Abnormal   Collection Time    07/04/13  1:45 AM      Result Value Ref Range   Yeast Wet Prep HPF POC NONE SEEN  NONE SEEN   Trich, Wet Prep NONE SEEN  NONE SEEN   Clue Cells Wet Prep HPF POC FEW (*) NONE SEEN   WBC, Wet Prep HPF POC FEW (*) NONE SEEN     Assessment and Plan  Bacterial Vaginosis UTI  Plan: Discharge to home Keflex 500 mg TID x 7 days Flagyl 500 mg BID x 7 days Urine culture   Dana Cole Dana Cole 07/04/2013, 1:39 AM

## 2013-07-04 NOTE — Discharge Instructions (Signed)

## 2013-07-04 NOTE — MAU Note (Signed)
Pt reports pain and pressure in vaginal area and lower abd pain for a few days, worsening tonight.

## 2013-07-05 LAB — GC/CHLAMYDIA PROBE AMP
CT Probe RNA: NEGATIVE
GC Probe RNA: NEGATIVE

## 2013-07-05 NOTE — MAU Provider Note (Signed)
Attestation of Attending Supervision of Advanced Practitioner (CNM/NP): Evaluation and management procedures were performed by the Advanced Practitioner under my supervision and collaboration. I have reviewed the Advanced Practitioner's note and chart, and I agree with the management and plan.  Meliss Fleek H Elidia Bonenfant 1:27 PM   

## 2013-07-07 LAB — URINE CULTURE: Colony Count: >=100000

## 2014-01-17 ENCOUNTER — Encounter (HOSPITAL_COMMUNITY): Payer: Self-pay | Admitting: *Deleted

## 2015-01-28 ENCOUNTER — Encounter (HOSPITAL_COMMUNITY): Payer: Self-pay | Admitting: *Deleted

## 2015-01-28 DIAGNOSIS — Z791 Long term (current) use of non-steroidal anti-inflammatories (NSAID): Secondary | ICD-10-CM | POA: Insufficient documentation

## 2015-01-28 DIAGNOSIS — Z792 Long term (current) use of antibiotics: Secondary | ICD-10-CM | POA: Insufficient documentation

## 2015-01-28 DIAGNOSIS — K121 Other forms of stomatitis: Secondary | ICD-10-CM | POA: Insufficient documentation

## 2015-01-28 DIAGNOSIS — Z720 Tobacco use: Secondary | ICD-10-CM | POA: Insufficient documentation

## 2015-01-28 DIAGNOSIS — Z79899 Other long term (current) drug therapy: Secondary | ICD-10-CM | POA: Insufficient documentation

## 2015-01-28 DIAGNOSIS — N390 Urinary tract infection, site not specified: Secondary | ICD-10-CM | POA: Insufficient documentation

## 2015-01-28 NOTE — ED Notes (Signed)
The pt has a lesion in her mouth for one week and she thinks she has a uti.  She has painful urination

## 2015-01-29 ENCOUNTER — Emergency Department (HOSPITAL_COMMUNITY)
Admission: EM | Admit: 2015-01-29 | Discharge: 2015-01-29 | Disposition: A | Discharge status: Home / Self Care | Payer: Medicaid Other | Attending: Emergency Medicine | Admitting: Emergency Medicine

## 2015-01-29 ENCOUNTER — Encounter (HOSPITAL_COMMUNITY): Payer: Self-pay | Admitting: Emergency Medicine

## 2015-01-29 DIAGNOSIS — N3090 Cystitis, unspecified without hematuria: Secondary | ICD-10-CM | POA: Insufficient documentation

## 2015-01-29 DIAGNOSIS — F17210 Nicotine dependence, cigarettes, uncomplicated: Secondary | ICD-10-CM | POA: Insufficient documentation

## 2015-01-29 DIAGNOSIS — M791 Myalgia: Secondary | ICD-10-CM | POA: Insufficient documentation

## 2015-01-29 DIAGNOSIS — K121 Other forms of stomatitis: Secondary | ICD-10-CM

## 2015-01-29 DIAGNOSIS — R51 Headache: Secondary | ICD-10-CM | POA: Insufficient documentation

## 2015-01-29 DIAGNOSIS — R531 Weakness: Secondary | ICD-10-CM | POA: Insufficient documentation

## 2015-01-29 DIAGNOSIS — Z79899 Other long term (current) drug therapy: Secondary | ICD-10-CM | POA: Insufficient documentation

## 2015-01-29 DIAGNOSIS — R05 Cough: Secondary | ICD-10-CM | POA: Insufficient documentation

## 2015-01-29 DIAGNOSIS — M2550 Pain in unspecified joint: Secondary | ICD-10-CM | POA: Insufficient documentation

## 2015-01-29 DIAGNOSIS — N390 Urinary tract infection, site not specified: Secondary | ICD-10-CM

## 2015-01-29 DIAGNOSIS — H53149 Visual discomfort, unspecified: Secondary | ICD-10-CM | POA: Insufficient documentation

## 2015-01-29 LAB — URINALYSIS, ROUTINE W REFLEX MICROSCOPIC
Bilirubin Urine: NEGATIVE
Bilirubin Urine: NEGATIVE
GLUCOSE, UA: NEGATIVE mg/dL
Glucose, UA: NEGATIVE mg/dL
Ketones, ur: NEGATIVE mg/dL
Ketones, ur: NEGATIVE mg/dL
NITRITE: NEGATIVE
Nitrite: NEGATIVE
PH: 6.5 (ref 5.0–8.0)
Protein, ur: NEGATIVE mg/dL
Protein, ur: NEGATIVE mg/dL
SPECIFIC GRAVITY, URINE: 1.008 (ref 1.005–1.030)
SPECIFIC GRAVITY, URINE: 1.012 (ref 1.005–1.030)
Urobilinogen, UA: 0.2 mg/dL (ref 0.0–1.0)
Urobilinogen, UA: 0.2 mg/dL (ref 0.0–1.0)
pH: 6 (ref 5.0–8.0)

## 2015-01-29 LAB — URINE MICROSCOPIC-ADD ON

## 2015-01-29 LAB — COMPREHENSIVE METABOLIC PANEL
ALBUMIN: 3.5 g/dL (ref 3.5–5.0)
ALT: 24 U/L (ref 14–54)
AST: 22 U/L (ref 15–41)
Alkaline Phosphatase: 47 U/L (ref 38–126)
Anion gap: 8 (ref 5–15)
BUN: 9 mg/dL (ref 6–20)
CHLORIDE: 107 mmol/L (ref 101–111)
CO2: 23 mmol/L (ref 22–32)
Calcium: 8.9 mg/dL (ref 8.9–10.3)
Creatinine, Ser: 0.88 mg/dL (ref 0.44–1.00)
GFR calc Af Amer: >60 mL/min (ref >60)
GFR calc non Af Amer: >60 mL/min (ref >60)
GLUCOSE: 128 mg/dL — AB (ref 65–99)
POTASSIUM: 3.7 mmol/L (ref 3.5–5.1)
Sodium: 138 mmol/L (ref 135–145)
Total Bilirubin: 1.8 mg/dL — ABNORMAL HIGH (ref 0.3–1.2)
Total Protein: 6.6 g/dL (ref 6.5–8.1)

## 2015-01-29 LAB — CBC
HEMATOCRIT: 39.3 % (ref 36.0–46.0)
Hemoglobin: 13.4 g/dL (ref 12.0–15.0)
MCH: 29.6 pg (ref 26.0–34.0)
MCHC: 34.1 g/dL (ref 30.0–36.0)
MCV: 86.8 fL (ref 78.0–100.0)
Platelets: 197 10*3/uL (ref 150–400)
RBC: 4.53 MIL/uL (ref 3.87–5.11)
RDW: 12.8 % (ref 11.5–15.5)
WBC: 11.9 10*3/uL — ABNORMAL HIGH (ref 4.0–10.5)

## 2015-01-29 LAB — I-STAT TROPONIN, ED: Troponin i, poc: 0 ng/mL (ref 0.00–0.08)

## 2015-01-29 LAB — LIPASE, BLOOD: LIPASE: 26 U/L (ref 11–51)

## 2015-01-29 MED ORDER — CEPHALEXIN 250 MG PO CAPS: 500.0000 mg | ORAL_CAPSULE | Freq: Once | ORAL | Status: DC

## 2015-01-29 MED ORDER — DEXTROSE 5 % IV SOLN
1.0000 g | Freq: Once | INTRAVENOUS | Status: AC
  Administered 2015-01-29: 1 g via INTRAVENOUS
  Filled 2015-01-29: qty 10

## 2015-01-29 MED ORDER — SODIUM CHLORIDE 0.9 % IV BOLUS (SEPSIS)
1000.0000 mL | Freq: Once | INTRAVENOUS | Status: AC
  Administered 2015-01-29: 1000 mL via INTRAVENOUS

## 2015-01-29 MED ORDER — CEPHALEXIN 500 MG PO CAPS: 500.0000 mg | ORAL_CAPSULE | Freq: Four times a day (QID) | ORAL | Status: DC

## 2015-01-29 MED ORDER — HYDROCODONE-ACETAMINOPHEN 5-325 MG PO TABS
1.0000 | ORAL_TABLET | Freq: Once | ORAL | Status: AC
  Administered 2015-01-29: 1 via ORAL
  Filled 2015-01-29: qty 1

## 2015-01-29 NOTE — Discharge Instructions (Signed)
1. Medications: Please take all of your antibiotics until finished!, continue usual home medications 2. Treatment: rest, drink plenty of fluids. 3. Follow Up: Please follow up with your primary doctor within 3 days for discussion of your diagnoses and further evaluation after today's visit; Please return to the ER for worsening abdominal pain, fever not controlled by ibuprofen/tylenol, new or worsening symptoms, any additional concerns.    Urinary Tract Infection Urinary tract infections (UTIs) can develop anywhere along your urinary tract. Your urinary tract is your body's drainage system for removing wastes and extra water. Your urinary tract includes two kidneys, two ureters, a bladder, and a urethra. Your kidneys are a pair of bean-shaped organs. Each kidney is about the size of your fist. They are located below your ribs, one on each side of your spine. CAUSES Infections are caused by microbes, which are microscopic organisms, including fungi, viruses, and bacteria. These organisms are so small that they can only be seen through a microscope. Bacteria are the microbes that most commonly cause UTIs. SYMPTOMS  Symptoms of UTIs may vary by age and gender of the patient and by the location of the infection. Symptoms in young women typically include a frequent and intense urge to urinate and a painful, burning feeling in the bladder or urethra during urination. Older women and men are more likely to be tired, shaky, and weak and have muscle aches and abdominal pain. A fever may mean the infection is in your kidneys. Other symptoms of a kidney infection include pain in your back or sides below the ribs, nausea, and vomiting. DIAGNOSIS To diagnose a UTI, your caregiver will ask you about your symptoms. Your caregiver will also ask you to provide a urine sample. The urine sample will be tested for bacteria and white blood cells. White blood cells are made by your body to help fight infection. TREATMENT    Typically, UTIs can be treated with medication. Because most UTIs are caused by a bacterial infection, they usually can be treated with the use of antibiotics. The choice of antibiotic and length of treatment depend on your symptoms and the type of bacteria causing your infection. HOME CARE INSTRUCTIONS  If you were prescribed antibiotics, take them exactly as your caregiver instructs you. Finish the medication even if you feel better after you have only taken some of the medication.  Drink enough water and fluids to keep your urine clear or pale yellow.  Avoid caffeine, tea, and carbonated beverages. They tend to irritate your bladder.  Empty your bladder often. Avoid holding urine for long periods of time.  Empty your bladder before and after sexual intercourse.  After a bowel movement, women should cleanse from front to back. Use each tissue only once. SEEK MEDICAL CARE IF:   You have back pain.  You develop a fever.  Your symptoms do not begin to resolve within 3 days. SEEK IMMEDIATE MEDICAL CARE IF:   You have severe back pain or lower abdominal pain.  You develop chills.  You have nausea or vomiting.  You have continued burning or discomfort with urination. MAKE SURE YOU:   Understand these instructions.  Will watch your condition.  Will get help right away if you are not doing well or get worse.   This information is not intended to replace advice given to you by your health care provider. Make sure you discuss any questions you have with your health care provider.   Document Released: 12/12/2004 Document Revised: 11/23/2014 Document  Reviewed: 04/12/2011 Else[employer] Interactive Patient Education [employer]

## 2015-01-29 NOTE — ED Notes (Signed)
Pt verbalized understanding of d/c instructions and has no further questions. Pt to follow up with a dentist and with PCP.

## 2015-01-29 NOTE — ED Notes (Signed)
Pt c/o vomiting and body aches onset today.

## 2015-01-29 NOTE — Discharge Instructions (Signed)
Urinary Tract Infection °Urinary tract infections (UTIs) can develop anywhere along your urinary tract. Your urinary tract is your body's drainage system for removing wastes and extra water. Your urinary tract includes two kidneys, two ureters, a bladder, and a urethra. Your kidneys are a pair of bean-shaped organs. Each kidney is about the size of your fist. They are located below your ribs, one on each side of your spine. °CAUSES °Infections are caused by microbes, which are microscopic organisms, including fungi, viruses, and bacteria. These organisms are so small that they can only be seen through a microscope. Bacteria are the microbes that most commonly cause UTIs. °SYMPTOMS  °Symptoms of UTIs may vary by age and gender of the patient and by the location of the infection. Symptoms in young women typically include a frequent and intense urge to urinate and a painful, burning feeling in the bladder or urethra during urination. Older women and men are more likely to be tired, shaky, and weak and have muscle aches and abdominal pain. A fever may mean the infection is in your kidneys. Other symptoms of a kidney infection include pain in your back or sides below the ribs, nausea, and vomiting. °DIAGNOSIS °To diagnose a UTI, your caregiver will ask you about your symptoms. Your caregiver will also ask you to provide a urine sample. The urine sample will be tested for bacteria and white blood cells. White blood cells are made by your body to help fight infection. °TREATMENT  °Typically, UTIs can be treated with medication. Because most UTIs are caused by a bacterial infection, they usually can be treated with the use of antibiotics. The choice of antibiotic and length of treatment depend on your symptoms and the type of bacteria causing your infection. °HOME CARE INSTRUCTIONS °· If you were prescribed antibiotics, take them exactly as your caregiver instructs you. Finish the medication even if you feel better after  you have only taken some of the medication. °· Drink enough water and fluids to keep your urine clear or pale yellow. °· Avoid caffeine, tea, and carbonated beverages. They tend to irritate your bladder. °· Empty your bladder often. Avoid holding urine for long periods of time. °· Empty your bladder before and after sexual intercourse. °· After a bowel movement, women should cleanse from front to back. Use each tissue only once. °SEEK MEDICAL CARE IF:  °· You have back pain. °· You develop a fever. °· Your symptoms do not begin to resolve within 3 days. °SEEK IMMEDIATE MEDICAL CARE IF:  °· You have severe back pain or lower abdominal pain. °· You develop chills. °· You have nausea or vomiting. °· You have continued burning or discomfort with urination. °MAKE SURE YOU:  °· Understand these instructions. °· Will watch your condition. °· Will get help right away if you are not doing well or get worse. °  °This information is not intended to replace advice given to you by your health care provider. Make sure you discuss any questions you have with your health care provider. °  °Document Released: 12/12/2004 Document Revised: 11/23/2014 Document Reviewed: 04/12/2011 °Elsevier Interactive Patient Education ©2016 Elsevier Inc. ° °Emergency Department Resource Guide °1) Find a Doctor and Pay Out of Pocket °Although you won't have to find out who is covered by your insurance plan, it is a good idea to ask around and get recommendations. You will then need to call the office and see if the doctor you have chosen will accept you as a new   patient and what types of options they offer for patients who are self-pay. Some doctors offer discounts or will set up payment plans for their patients who do not have insurance, but you will need to ask so you aren't surprised when you get to your appointment. ° °2) Contact Your Local Health Department °Not all health departments have doctors that can see patients for sick visits, but many  do, so it is worth a call to see if yours does. If you don't know where your local health department is, you can check in your phone book. The CDC also has a tool to help you locate your state's health department, and many state websites also have listings of all of their local health departments. ° °3) Find a Walk-in Clinic °If your illness is not likely to be very severe or complicated, you may want to try a walk in clinic. These are popping up all over the country in pharmacies, drugstores, and shopping centers. They're usually staffed by nurse practitioners or physician assistants that have been trained to treat common illnesses and complaints. They're usually fairly quick and inexpensive. However, if you have serious medical issues or chronic medical problems, these are probably not your best option. ° °No Primary Care Doctor: °- Call [facility] at  832-8000 - they can help you locate a primary care doctor that  accepts your insurance, provides certain services, etc. °- Physician Referral Service- 1-800-533-3463 ° °Chronic Pain Problems: °Organization         Address  Phone   Notes  °[facility]  ([phone]) [phone] Patients need to be referred by their primary care doctor.  ° °Medication Assistance: °Organization         Address  Phone   Notes  °[place] [facility] [address] °[place], [place] [address] ([phone]) 641-8030 --Must be a resident of [place] °-- Must have NO insurance coverage whatsoever (no Medicaid/ Medicare, etc.) °-- The pt. MUST have a primary care doctor that directs their care regularly and follows them in the community °  °[facility]  (866) 331-1348   °[employer]  (888) 892-1162   ° °Agencies that provide inexpensive medical care: °Organization         Address  Phone   Notes  °[facility]  ([phone]) [phone]   °[facility]    (336) 832-7272   °[facility] [address] °[place], [place] [address] ([phone]) 832-4777   °[facility] [address], °[place] (336) 271-4999   °[facility]    (336) 373-0678   °[facility]    (336) 272-1050   °[facility] ° [address], [place] Phone:  (336) 832-4444, Fax:  (336) [phone] Hours of Operation:  9 am - 6 pm, M-F.  Also accepts Medicaid/Medicare and self-pay.  °[facility] ° [address], [place] Phone: (336) [phone], Fax: ([phone]) [phone]. Hours of Operation:  8:30 am - 5:30 pm, M-F.  Also accepts Medicaid and self-pay.  °[facility] [place] [address], [place] Phone: (336) [phone]   °[facility] [address], [place], [place] (336)[phone], Ext. 123 Mondays & Thursdays: 7-9 AM.  First 15 patients are seen on a first come, first serve basis. °  ° °Medicaid-accepting [place] Providers: ° °Organization         Address    Phone   Notes  °[facility] [address], [place] ([phone]) [phone] Also accepts self-pay patients.  °[facility] [address], [place] ° (336) [phone]   °[facility] [address], [place] (336) [phone]   °[facility] [address] [place] [address], [place] (336) [phone]   °[facility] [address], [place]  ° (336) [phone] Only accepts [place] Medicaid patients after they have their name applied to their card.  ° °Self-Pay (no insurance) in [place]: ° °Organization         Address  Phone   Notes  °Sickle Cell Patients, [facility] [address], [place] (336) [phone]   °[facility] [address], [place] (336) [phone]   °[facility] ° [address], [place] (336) [phone]   °[facility]/Dr. Osei-Bonsu ° [address] [place] [address], [place] or  [address], [place] ([phone]) [phone] Phone number for both [place] and [place] locations is the same.  °[facility] [address], [place] ([phone]) [phone]   °[facility] [address] [place] [address], [place] or [address] (336) 852-7530 °(336) 878-2260   °[facility] [address], [place] (336) 350-1642, phone; (336) [phone], fax Sees patients 1st and 3rd Saturday of every month.  Must not qualify for public or private insurance (i.e. Medicaid, Medicare, [place] Health Choice, Veterans' Benefits) • Household income should be no more than 200% of the poverty level •The clinic cannot treat you if you are pregnant or think you are pregnant • Sexually transmitted diseases are not treated at the clinic.  ° ° °Dental Care: °Organization         Address  Phone  Notes  °[facility] [facility] [address], [place] (336) [phone] Accepts children up to age 21 who are enrolled in Medicaid or [place] Health Choice; pregnant women with a Medicaid card; and children who have applied for Medicaid or [place] Health Choice, but were declined, whose parents can pay a reduced fee at time of service.  °[facility] [place]  [address], [place] (336) 641-7733 Accepts children up to age 21 who are enrolled in Medicaid or [place] Health Choice; pregnant women with a Medicaid card; and children who have applied for Medicaid or [place] Health Choice, but were declined, whose parents can pay a reduced fee at time of service.  °[facility] ° [address], [place] (336) [phone] Patients are seen by appointment only. Walk-ins are not accepted. [facility] will see patients 18 years of age and older. °Monday - Tuesday (8am-5pm) °Most Wednesdays (8:30-5pm) °$30 per visit, cash only  °[facility] ° [address], [place] ([phone]) [phone] Patients are seen by appointment only. Walk-ins are not accepted. [facility] will see patients 18 years of age and older. °One Wednesday Evening (Monthly: Volunteer Based).  $30 per visit, cash only  °[facility]  ([phone]) [phone] for adults; Children under age 4, call Graduate Pediatric Dentistry at ([phone]) [phone]. Children aged 4-14, please call ([phone]) [phone] to request a pediatric application. ° Dental services are provided in all areas of dental care including fillings, crowns   and bridges, complete and partial dentures, implants, gum treatment, root canals, and extractions. Preventive care is also provided. Treatment is provided to both adults and children. Patients are selected via a lottery and there is often a waiting list.   [facility] [address], [place]  [phone] [email or web address]   [facility] [address], [place], [place] [phone], Ext. [phone] Second and Fourth Thursday of each month, opens at 6:30 AM; Clinic ends at 9 AM.  Patients are seen on a first-come first-served basis, and a limited number are seen during each clinic.   [facility]  [address], [place], [place] [phone]   Eligibility Requirements You must have lived in [place], [place], or [place] for at least the last three months.   You cannot be eligible for state or federal sponsored [place] insurance, including [employer]IG[employer]eterans [employer], [place], or [employer]icare.   You generally cannot be eligible for healthcare insurance through your employer.    How to apply: Eligibility screenings are held every Tuesday and Wednesday afternoon from 1:00 pm until 4:00 pm. You do not need an appointment for the interview!  [facility] [address], [place], [place] [phone]   [facility]  [phone]   [facility]  [phone]   [facility]  [phone]

## 2015-01-29 NOTE — ED Provider Notes (Signed)
CSN: [id number]     Arrival date & time 01/29/15  1034 History   First MD Initiated Contact with Patient 01/29/15 1104     Chief Complaint  Patient presents with  . Emesis  . Generalized Body Aches     (Consider location/radiation/quality/duration/timing/severity/associated sxs/prior Treatment) Patient is a 34 y.o. female presenting with vomiting. The history is provided by the patient, a relative and medical records. No language interpreter was used.  Emesis Associated symptoms: abdominal pain, arthralgias, chills, headaches and myalgias   Associated symptoms: no diarrhea and no sore throat    Dana Cole is a 34 y.o. female  with no significant PMH who presents to the Emergency Department complaining of sudden onset, constant, persistent generalized body aches and headache with multiple (>10) episodes of clear emesis. Movement and light makes it worse, no alleviating factors noted. 10/10 pain.   Of note, patient was seen here yesterday night/this morning for UTI infection.    History reviewed. No pertinent past medical history. Past Surgical History  Procedure Laterality Date  . Joint replacement    . Hip surgery    . Tubal ligation     Family History  Problem Relation Age of Onset  . Hypertension Other   . Diabetes Other    Social History  Substance Use Topics  . Smoking status: Current Some Day Smoker    Types: Cigarettes  . Smokeless tobacco: None  . Alcohol Use: Yes     Comment: occassional    OB History    Gravida Para Term Preterm AB TAB SAB Ectopic Multiple Living   5 4 3 1 1  1   4      Review of Systems  Constitutional: Positive for chills. Negative for fever and diaphoresis.  HENT: Negative for congestion, rhinorrhea and sore throat.   Eyes: Positive for photophobia. Negative for visual disturbance.  Respiratory: Positive for cough. Negative for shortness of breath and wheezing.   Cardiovascular: Negative.   Gastrointestinal: Positive for vomiting  and abdominal pain. Negative for nausea, diarrhea and constipation.  Endocrine: Negative for polydipsia and polyuria.  Musculoskeletal: Positive for myalgias, back pain and arthralgias. Negative for neck pain.  Skin: Negative for rash.  Neurological: Positive for weakness and headaches. Negative for dizziness.      Allergies  Shellfish allergy  Home Medications   Prior to Admission medications   Medication Sig Start Date End Date Taking? Authorizing Provider  cyclobenzaprine (FLEXERIL) 5 MG tablet Take 5 mg by mouth at bedtime.   Yes Historical Provider, MD  diclofenac (VOLTAREN) 75 MG EC tablet Take 75 mg by mouth 2 (two) times daily.   Yes Historical Provider, MD  cephALEXin (KEFLEX) 500 MG capsule Take 1 capsule (500 mg total) by mouth 4 (four) times daily. Patient not taking: Reported on 01/29/2015 01/29/15   Elpidio AnisShari Upstill, PA-C  methocarbamol (ROBAXIN) 750 MG tablet Take 750 mg by mouth 2 (two) times daily as needed for muscle spasms.    Historical Provider, MD  metroNIDAZOLE (FLAGYL) 500 MG tablet Take 1 tablet (500 mg total) by mouth 2 (two) times daily. Patient not taking: Reported on 01/29/2015 07/04/13   Marlis EdelsonWalidah N Karim, CNM   BP 106/80 mmHg  Pulse 99  Temp(Src) 99.5 F (37.5 C) (Oral)  Resp 18  Ht 5\' 8"  (1.727 m)  Wt 264 lb (119.75 kg)  BMI 40.15 kg/m2  SpO2 97%  LMP 01/02/2015 (Approximate) Physical Exam  Constitutional: She is oriented to person, place, and time. She appears  well-developed and well-nourished.  Ill appearing but in no acute distress  HENT:  Head: Normocephalic and atraumatic.  Cardiovascular: Normal rate, regular rhythm, normal heart sounds and intact distal pulses.  Exam reveals no gallop and no friction rub.   No murmur heard. Pulmonary/Chest: Effort normal and breath sounds normal. No respiratory distress. She has no wheezes. She has no rales. She exhibits no tenderness.  Abdominal: She exhibits no mass. There is no rebound and no guarding.   Abdomen soft, non-distended Generalized abdominal tenderness Bowel sounds positive in all four quadrants  Musculoskeletal: She exhibits no edema.  No CVA tenderness  Neurological: She is alert and oriented to person, place, and time.  Skin: Skin is warm and dry. No rash noted.  Psychiatric: She has a normal mood and affect. Her behavior is normal. Judgment and thought content normal.  Nursing note and vitals reviewed.   ED Course  Procedures (including critical care time) Labs Review Labs Reviewed  COMPREHENSIVE METABOLIC PANEL - Abnormal; Notable for the following:    Glucose, Bld 128 (*)    Total Bilirubin 1.8 (*)    All other components within normal limits  CBC - Abnormal; Notable for the following:    WBC 11.9 (*)    All other components within normal limits  URINALYSIS, ROUTINE W REFLEX MICROSCOPIC (NOT AT [facility]) - Abnormal; Notable for the following:    APPearance CLOUDY (*)    Hgb urine dipstick MODERATE (*)    Leukocytes, UA MODERATE (*)    All other components within normal limits  URINE MICROSCOPIC-ADD ON - Abnormal; Notable for the following:    Squamous Epithelial / LPF FEW (*)    Bacteria, UA MANY (*)    All other components within normal limits  URINE CULTURE  LIPASE, BLOOD  I-STAT BETA HCG BLOOD, ED ([facility], [facility], [facility] ONLY)  I-STAT TROPOININ, ED    Imaging Review No results found. I have personally reviewed and evaluated these images and lab results as part of my medical decision-making.   EKG Interpretation None      MDM   Final diagnoses:  Cystitis   Dana Cole presents with worsening generalized abdominal pain, multiple episodes of emesis. Also complaining of headache.   Labs: UA with moderate leuk, many bacteria - was diagnosed with UTI yesterday and has not started ABX yet.  Lipase wdl, negative trop, CMP reassuring - bili 1.8; white count 11.9   Patient states she has had several UTI infections over the past 4 months. Will send urine for cx  to ensure proper ABX due to ongoing issue.   I explained the diagnosis and have given precautions as to when/if to return to the ER including for any other new or worsening symptoms. The patient understands and accepts the medical plan as it's been dictated and I have answered their questions. Discharge instructions concerning home care have been given, and I have stressed the importance of  The patient is stable and is discharged to home in good condition.     [facility] Shunte Senseney, PA-C 01/29/15 1435  Alvira Monday, MD 01/30/15 2250
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
## 2015-01-31 LAB — URINE CULTURE: Culture: >=100000

## 2015-02-01 ENCOUNTER — Telehealth (HOSPITAL_BASED_OUTPATIENT_CLINIC_OR_DEPARTMENT_OTHER): Payer: Self-pay | Admitting: Emergency Medicine

## 2015-02-01 NOTE — Telephone Encounter (Signed)
Post ED Visit - Positive Culture Follow-up  Culture report reviewed by antimicrobial stewardship pharmacist:  []  Dana Cole, Pharm.D. []  Dana Cole, Pharm.D., BCPS [x]  Dana Cole, Pharm.D. []  Dana Cole, Pharm.D., BCPS []  [place] Cole, [address] BCPS, AAHIVP []  Dana Cole, Pharm.D., BCPS, AAHIVP []  Tennis Mustassie Cole, Pharm.D. []  Dana Cole, [address]  Positive urine culture E Coli Treated with keflex, organism sensitive to the same and no further patient follow-up is required at this time.  Dana Cole, Dana Cole 02/01/2015, 9:37 AM

## 2015-03-03 ENCOUNTER — Emergency Department (HOSPITAL_COMMUNITY)
Admission: EM | Admit: 2015-03-03 | Discharge: 2015-03-03 | Disposition: A | Discharge status: Home / Self Care | Payer: No Typology Code available for payment source | Attending: Emergency Medicine | Admitting: Emergency Medicine

## 2015-03-03 ENCOUNTER — Emergency Department (HOSPITAL_COMMUNITY): Payer: No Typology Code available for payment source

## 2015-03-03 ENCOUNTER — Encounter (HOSPITAL_COMMUNITY): Payer: Self-pay | Admitting: Emergency Medicine

## 2015-03-03 DIAGNOSIS — S8991XA Unspecified injury of right lower leg, initial encounter: Secondary | ICD-10-CM | POA: Diagnosis not present

## 2015-03-03 DIAGNOSIS — F17210 Nicotine dependence, cigarettes, uncomplicated: Secondary | ICD-10-CM | POA: Insufficient documentation

## 2015-03-03 DIAGNOSIS — Y92410 Unspecified street and highway as the place of occurrence of the external cause: Secondary | ICD-10-CM | POA: Diagnosis not present

## 2015-03-03 DIAGNOSIS — Y9389 Activity, other specified: Secondary | ICD-10-CM | POA: Insufficient documentation

## 2015-03-03 DIAGNOSIS — M25561 Pain in right knee: Secondary | ICD-10-CM

## 2015-03-03 DIAGNOSIS — Y998 Other external cause status: Secondary | ICD-10-CM | POA: Diagnosis not present

## 2015-03-03 DIAGNOSIS — S39012A Strain of muscle, fascia and tendon of lower back, initial encounter: Secondary | ICD-10-CM

## 2015-03-03 DIAGNOSIS — Z791 Long term (current) use of non-steroidal anti-inflammatories (NSAID): Secondary | ICD-10-CM | POA: Insufficient documentation

## 2015-03-03 DIAGNOSIS — S3992XA Unspecified injury of lower back, initial encounter: Secondary | ICD-10-CM | POA: Diagnosis present

## 2015-03-03 NOTE — Discharge Instructions (Signed)
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
## 2015-03-19 NOTE — L&D Delivery Note (Signed)
Delivery Note Mother presented in active labor and precipitously delivered without complication. At 2:19 AM a viable female was delivered via Vaginal, Spontaneous Delivery (Presentation: vertex, ROA).  APGAR: 8, 9; weight pending.   Placenta status: spontaneous, intact.  Cord: 3v cord.   Anesthesia: None Episiotomy: None Lacerations: None Suture Repair: N/A Est. Blood Loss (mL): 100  Mom to postpartum.  Baby to Couplet care / Skin to Skin.  Jamelle HaringHillary M Fitzgerald, MD [facility], PGY-2 02/04/2016, 3:03 AM  OB FELLOW DELIVERY ATTESTATION  I was gloved and present for the delivery in its entirety, and I agree with the above resident's note.     Patient was given 10mg  IM pitocin and 800mcg buccal cytotec at delivery as she did not have IV access.   Ernestina PennaNicholas Loyalty Arentz, MD 6:11 AM

## 2015-06-03 ENCOUNTER — Inpatient Hospital Stay (HOSPITAL_COMMUNITY)
Admission: AD | Admit: 2015-06-03 | Discharge: 2015-06-03 | Disposition: A | Discharge status: Home / Self Care | Payer: No Typology Code available for payment source | Source: Ambulatory Visit | Attending: Obstetrics & Gynecology | Admitting: Obstetrics & Gynecology

## 2015-06-03 ENCOUNTER — Encounter (HOSPITAL_COMMUNITY): Payer: Self-pay | Admitting: *Deleted

## 2015-06-03 DIAGNOSIS — F17210 Nicotine dependence, cigarettes, uncomplicated: Secondary | ICD-10-CM | POA: Insufficient documentation

## 2015-06-03 DIAGNOSIS — B9689 Other specified bacterial agents as the cause of diseases classified elsewhere: Secondary | ICD-10-CM

## 2015-06-03 DIAGNOSIS — Z833 Family history of diabetes mellitus: Secondary | ICD-10-CM | POA: Insufficient documentation

## 2015-06-03 DIAGNOSIS — Z8249 Family history of ischemic heart disease and other diseases of the circulatory system: Secondary | ICD-10-CM | POA: Insufficient documentation

## 2015-06-03 DIAGNOSIS — N760 Acute vaginitis: Secondary | ICD-10-CM | POA: Insufficient documentation

## 2015-06-03 DIAGNOSIS — A499 Bacterial infection, unspecified: Secondary | ICD-10-CM

## 2015-06-03 LAB — WET PREP, GENITAL
Sperm: NONE SEEN
Trich, Wet Prep: NONE SEEN
Yeast Wet Prep HPF POC: NONE SEEN

## 2015-06-03 MED ORDER — METRONIDAZOLE 500 MG PO TABS: 500.0000 mg | ORAL_TABLET | Freq: Two times a day (BID) | ORAL | Status: DC

## 2015-06-03 NOTE — MAU Provider Note (Signed)
CSN: [id number]     Arrival date & time 06/03/15  1839 History   None    Chief Complaint  Patient presents with  . Vaginal Discharge     (Consider location/radiation/quality/duration/timing/severity/associated sxs/prior Treatment) HPI Dana Cole is a 35 y.o. [id number] who presents to the MAU with vaginal d/c and vaginal burning that started a month ago. Patient reports that she used OTC yeast medication without relief. During her menses the symptoms went away but after they came back the same. She has noted a few areas on the out side of her vagina that are tender. She denies UTI symptoms. Current sex partner x 10 years.   History reviewed. No pertinent past medical history. Past Surgical History  Procedure Laterality Date  . Joint replacement    . Hip surgery    . Tubal ligation     Family History  Problem Relation Age of Onset  . Hypertension Other   . Diabetes Other    Social History  Substance Use Topics  . Smoking status: Current Some Day Smoker    Types: Cigarettes  . Smokeless tobacco: None  . Alcohol Use: Yes     Comment: occassional    OB History    Gravida Para Term Preterm AB TAB SAB Ectopic Multiple Living   Review of Systems  Genitourinary: Positive for vaginal discharge and vaginal pain.  all other systems negative    Allergies  Shellfish allergy  Home Medications   Prior to Admission medications   Medication Sig Start Date End Date Taking? Authorizing Provider  cephALEXin (KEFLEX) 500 MG capsule Take 1 capsule (500 mg total) by mouth 4 (four) times daily. Patient not taking: Reported on 01/29/2015 01/29/15   Elpidio Anis, PA-C  cyclobenzaprine (FLEXERIL) 5 MG tablet Take 5 mg by mouth at bedtime.    Historical Provider, MD  diclofenac (VOLTAREN) 75 MG EC tablet Take 75 mg by mouth 2 (two) times daily.    Historical Provider, MD  methocarbamol (ROBAXIN) 750 MG tablet Take 750 mg by mouth 2 (two) times daily as needed for  muscle spasms.    Historical Provider, MD  metroNIDAZOLE (FLAGYL) 500 MG tablet Take 1 tablet (500 mg total) by mouth 2 (two) times daily. Patient not taking: Reported on 01/29/2015 07/04/13   Marlis Edelson, CNM  metroNIDAZOLE (FLAGYL) 500 MG tablet Take 1 tablet (500 mg total) by mouth 2 (two) times daily. 06/03/15   Hope Orlene Och, NP   BP 146/89 mmHg  Pulse 102  Temp(Src) 98.4 F (36.9 C) (Oral)  Resp 18 Physical Exam  Constitutional: She is oriented to person, place, and time. She appears well-developed and well-nourished.  HENT:  Head: Normocephalic.  Eyes: EOM are normal.  Neck: Neck supple.  Cardiovascular: Normal rate.   Pulmonary/Chest: Effort normal.  Abdominal: Soft. There is no tenderness.  Genitourinary:  External genitalia with 2 small lesions that are tender. Frothy, malodorous d/c vaginal vault, no CMT, no adnexal tenderness. Uterus without palpable enlargement.   Musculoskeletal: Normal range of motion.  Neurological: She is alert and oriented to person, place, and time. No cranial nerve deficit.  Skin: Skin is warm and dry.  Psychiatric: She has a normal mood and affect. Her behavior is normal.  Nursing note and vitals reviewed.   ED Course  Procedures (including critical care time) Labs Review Results for orders placed or performed during the hospital encounter of 06/03/15 (  from the past 48 hour(s))  Wet prep, genital     Status: Abnormal   Collection Time: 06/03/15  7:15 PM  Result Value Ref Range   Yeast Wet Prep HPF POC NONE SEEN NONE SEEN   Trich, Wet Prep NONE SEEN NONE SEEN   Clue Cells Wet Prep HPF POC PRESENT (A) NONE SEEN   WBC, Wet Prep HPF POC MODERATE (A) NONE SEEN    Comment: MODERATE BACTERIA SEEN   Sperm NONE SEEN      Imaging Review No results found. I have personally reviewed the lab results as part of my medical decision-making.   MDM  35 y.o. female with vaginal d/c stable for discharge without fever, pelvic pain or other symptoms  of PID. Cultures for GC and Chlamydia pending. Discussed with the patient clinical and lab findings and plan of care and all questioned fully answered. She will return if any problems arise.   Final diagnoses:  Bacterial vaginal infection

## 2015-06-03 NOTE — MAU Note (Signed)
Pt reports she has been having vaginal burning off and for about a month.Used monostat a few times without much releif. Reprots a clear creamy discharge without odor.

## 2015-06-03 NOTE — Discharge Instructions (Signed)

## 2015-06-05 LAB — HERPES SIMPLEX VIRUS CULTURE
Culture: NOT DETECTED
Special Requests: NORMAL

## 2015-06-05 LAB — GC/CHLAMYDIA PROBE AMP (~~LOC~~) NOT AT ARMC
Chlamydia: NEGATIVE
Neisseria Gonorrhea: NEGATIVE

## 2015-07-03 ENCOUNTER — Encounter (HOSPITAL_COMMUNITY): Payer: Self-pay | Admitting: *Deleted

## 2015-07-03 ENCOUNTER — Inpatient Hospital Stay (HOSPITAL_COMMUNITY): Payer: Medicaid Other

## 2015-07-03 ENCOUNTER — Inpatient Hospital Stay (HOSPITAL_COMMUNITY)
Admission: AD | Admit: 2015-07-03 | Discharge: 2015-07-03 | Disposition: A | Discharge status: Home / Self Care | Payer: Medicaid Other | Source: Ambulatory Visit | Attending: Family Medicine | Admitting: Family Medicine

## 2015-07-03 DIAGNOSIS — O26891 Other specified pregnancy related conditions, first trimester: Secondary | ICD-10-CM | POA: Insufficient documentation

## 2015-07-03 DIAGNOSIS — Z3A01 Less than 8 weeks gestation of pregnancy: Secondary | ICD-10-CM | POA: Diagnosis not present

## 2015-07-03 DIAGNOSIS — Z9851 Tubal ligation status: Secondary | ICD-10-CM | POA: Diagnosis not present

## 2015-07-03 DIAGNOSIS — O9989 Other specified diseases and conditions complicating pregnancy, childbirth and the puerperium: Secondary | ICD-10-CM

## 2015-07-03 DIAGNOSIS — F17210 Nicotine dependence, cigarettes, uncomplicated: Secondary | ICD-10-CM | POA: Insufficient documentation

## 2015-07-03 DIAGNOSIS — A499 Bacterial infection, unspecified: Secondary | ICD-10-CM

## 2015-07-03 DIAGNOSIS — R1030 Lower abdominal pain, unspecified: Secondary | ICD-10-CM | POA: Insufficient documentation

## 2015-07-03 DIAGNOSIS — Z9889 Other specified postprocedural states: Secondary | ICD-10-CM | POA: Insufficient documentation

## 2015-07-03 DIAGNOSIS — R112 Nausea with vomiting, unspecified: Secondary | ICD-10-CM | POA: Insufficient documentation

## 2015-07-03 DIAGNOSIS — R109 Unspecified abdominal pain: Secondary | ICD-10-CM

## 2015-07-03 DIAGNOSIS — O26899 Other specified pregnancy related conditions, unspecified trimester: Secondary | ICD-10-CM

## 2015-07-03 DIAGNOSIS — Z833 Family history of diabetes mellitus: Secondary | ICD-10-CM | POA: Diagnosis not present

## 2015-07-03 DIAGNOSIS — O99331 Smoking (tobacco) complicating pregnancy, first trimester: Secondary | ICD-10-CM | POA: Insufficient documentation

## 2015-07-03 DIAGNOSIS — Z8249 Family history of ischemic heart disease and other diseases of the circulatory system: Secondary | ICD-10-CM | POA: Diagnosis not present

## 2015-07-03 DIAGNOSIS — N760 Acute vaginitis: Secondary | ICD-10-CM

## 2015-07-03 DIAGNOSIS — O23591 Infection of other part of genital tract in pregnancy, first trimester: Secondary | ICD-10-CM

## 2015-07-03 DIAGNOSIS — B9689 Other specified bacterial agents as the cause of diseases classified elsewhere: Secondary | ICD-10-CM

## 2015-07-03 LAB — CBC WITH DIFFERENTIAL/PLATELET
BASOS PCT: 0 %
Basophils Absolute: 0 10*3/uL (ref 0.0–0.1)
Eosinophils Absolute: 0.1 10*3/uL (ref 0.0–0.7)
Eosinophils Relative: 1 %
HEMATOCRIT: 35.9 % — AB (ref 36.0–46.0)
HEMOGLOBIN: 12.4 g/dL (ref 12.0–15.0)
Lymphocytes Relative: 31 %
Lymphs Abs: 3.3 10*3/uL (ref 0.7–4.0)
MCH: 29.5 pg (ref 26.0–34.0)
MCHC: 34.5 g/dL (ref 30.0–36.0)
MCV: 85.5 fL (ref 78.0–100.0)
MONOS PCT: 6 %
Monocytes Absolute: 0.6 10*3/uL (ref 0.1–1.0)
NEUTROS ABS: 6.5 10*3/uL (ref 1.7–7.7)
NEUTROS PCT: 62 %
Platelets: 221 10*3/uL (ref 150–400)
RBC: 4.2 MIL/uL (ref 3.87–5.11)
RDW: 13.5 % (ref 11.5–15.5)
WBC: 10.5 10*3/uL (ref 4.0–10.5)

## 2015-07-03 LAB — URINALYSIS, ROUTINE W REFLEX MICROSCOPIC
Bilirubin Urine: NEGATIVE
Glucose, UA: NEGATIVE mg/dL
HGB URINE DIPSTICK: NEGATIVE
KETONES UR: NEGATIVE mg/dL
Leukocytes, UA: NEGATIVE
NITRITE: NEGATIVE
Protein, ur: NEGATIVE mg/dL
Specific Gravity, Urine: 1.015 (ref 1.005–1.030)
pH: 5.5 (ref 5.0–8.0)

## 2015-07-03 LAB — POCT PREGNANCY, URINE: Preg Test, Ur: POSITIVE — AB

## 2015-07-03 LAB — WET PREP, GENITAL
Sperm: NONE SEEN
Trich, Wet Prep: NONE SEEN
Yeast Wet Prep HPF POC: NONE SEEN

## 2015-07-03 LAB — HCG, QUANTITATIVE, PREGNANCY: hCG, Beta Chain, Quant, S: 37314 m[IU]/mL — ABNORMAL HIGH

## 2015-07-03 MED ORDER — METRONIDAZOLE 0.75 % VA GEL: 1.0000 | Freq: Two times a day (BID) | VAGINAL | Status: DC

## 2015-07-03 NOTE — MAU Note (Signed)
PT SAYS LMP WAS 3-6.    SHE STARTED CRAMPING  X2 WEEKS-   BUT  NO BL;EEDING.     BTL- 09-08-2007.    LAST SEX- 1 WK AGO.     NO HPT.

## 2015-07-03 NOTE — MAU Provider Note (Signed)
History     CSN: [id number]  Arrival date and time: 07/03/15 4098   First Provider Initiated Contact with Patient 07/03/15 2052      Chief Complaint  Patient presents with  . Abdominal Pain   HPI [name] is a 35 y.o. [id number] at Unknown who presents to MAU today with complaint of abdominal pain and amenorrhea. The patient states that she had BTL in 2009. She is sexually active and does not use protection. She states lower abdominal pain today rated at 6/10. She took Ibuprofen without relief. She denies vaginal bleeding, discharge, UTI symptoms, fever, diarrhea or constipation. She has had occasional N/V recently. She has not taken HPT, but LMP was 05/22/15.  OB History    Gravida Para Term Preterm AB TAB SAB Ectopic Multiple Living   No past medical history on file.  Past Surgical History  Procedure Laterality Date  . Joint replacement    . Hip surgery    . Tubal ligation      Family History  Problem Relation Age of Onset  . Hypertension Other   . Diabetes Other     Social History  Substance Use Topics  . Smoking status: Current Some Day Smoker    Types: Cigarettes  . Smokeless tobacco: None  . Alcohol Use: Yes     Comment: occassional     Allergies:  Allergies  Allergen Reactions  . Shellfish Allergy Itching and Swelling    Prescriptions prior to admission  Medication Sig Dispense Refill Last Dose  . cyclobenzaprine (FLEXERIL) 5 MG tablet Take 5 mg by mouth at bedtime.   01/28/2015 at Unknown time  . diclofenac (VOLTAREN) 75 MG EC tablet Take 75 mg by mouth 2 (two) times daily.   01/28/2015 at Unknown time  . methocarbamol (ROBAXIN) 750 MG tablet Take 750 mg by mouth 2 (two) times daily as needed for muscle spasms.   07/03/2013 at Unknown time  . metroNIDAZOLE (FLAGYL) 500 MG tablet Take 1 tablet (500 mg total) by mouth 2 (two) times daily. 14 tablet 0     Review of Systems  Constitutional: Negative for fever and  malaise/fatigue.  Gastrointestinal: Positive for nausea, vomiting and abdominal pain. Negative for diarrhea and constipation.  Genitourinary: Negative for dysuria, urgency and frequency.       Neg - vaginal bleeding, discharge   Physical Exam   Blood pressure 110/64, pulse 96, temperature 98.7 F (37.1 C), temperature source Oral, resp. rate 20, height  (1.702 m), weight 277 lb (125.646 kg), last menstrual period 05/22/2015.  Physical Exam  Nursing note and vitals reviewed. Constitutional: She is oriented to person, place, and time. She appears well-developed and well-nourished. No distress.  HENT:  Head: Normocephalic and atraumatic.  Cardiovascular: Normal rate.   Respiratory: Effort normal.  GI: Soft. She exhibits no distension and no mass. There is tenderness (very mild tenderness to palpation of the lower abdomen bilaterally). There is no rebound and no guarding.  Neurological: She is alert and oriented to person, place, and time.  Skin: Skin is warm and dry. No erythema.  Psychiatric: She has a normal mood and affect.   Results for orders placed or performed during the hospital encounter of 07/03/15 (from the past 24 hour(s))  Urinalysis, Routine w reflex microscopic (not at [facility])     Status: None   Collection Time: 07/03/15  8:14 PM  Result Value  Ref Range   Color, Urine YELLOW YELLOW   APPearance CLEAR CLEAR   Specific Gravity, Urine 1.015 1.005 - 1.030   pH 5.5 5.0 - 8.0   Glucose, UA NEGATIVE NEGATIVE mg/dL   Hgb urine dipstick NEGATIVE NEGATIVE   Bilirubin Urine NEGATIVE NEGATIVE   Ketones, ur NEGATIVE NEGATIVE mg/dL   Protein, ur NEGATIVE NEGATIVE mg/dL   Nitrite NEGATIVE NEGATIVE   Leukocytes, UA NEGATIVE NEGATIVE  Pregnancy, urine POC     Status: Abnormal   Collection Time: 07/03/15  8:24 PM  Result Value Ref Range   Preg Test, Ur POSITIVE (A) NEGATIVE  CBC with Differential/Platelet     Status: Abnormal   Collection Time: 07/03/15  8:55 PM  Result Value  Ref Range   WBC 10.5 4.0 - 10.5 K/uL   RBC 4.20 3.87 - 5.11 MIL/uL   Hemoglobin 12.4 12.0 - 15.0 g/dL   HCT 40.935.9 (L) 81.136.0 - 91.446.0 %   MCV 85.5 78.0 - 100.0 fL   MCH 29.5 26.0 - 34.0 pg   MCHC 34.5 30.0 - 36.0 g/dL   RDW 78.213.5 95.611.5 - 21.315.5 %   Platelets 221 150 - 400 K/uL   Neutrophils Relative % 62 %   Neutro Abs 6.5 1.7 - 7.7 K/uL   Lymphocytes Relative 31 %   Lymphs Abs 3.3 0.7 - 4.0 K/uL   Monocytes Relative 6 %   Monocytes Absolute 0.6 0.1 - 1.0 K/uL   Eosinophils Relative 1 %   Eosinophils Absolute 0.1 0.0 - 0.7 K/uL   Basophils Relative 0 %   Basophils Absolute 0.0 0.0 - 0.1 K/uL  hCG, quantitative, pregnancy     Status: Abnormal   Collection Time: 07/03/15  8:55 PM  Result Value Ref Range   hCG, Beta Chain, Quant, S 37314 (H) <5 mIU/mL  Wet prep, genital     Status: Abnormal   Collection Time: 07/03/15  9:55 PM  Result Value Ref Range   Yeast Wet Prep HPF POC NONE SEEN NONE SEEN   Trich, Wet Prep NONE SEEN NONE SEEN   Clue Cells Wet Prep HPF POC PRESENT (A) NONE SEEN   WBC, Wet Prep HPF POC MODERATE (A) NONE SEEN   Sperm NONE SEEN    [place] Ob Comp Less 14 Wks  07/03/2015  CLINICAL DATA:  Pregnant, abdominal pain EXAM: OBSTETRIC <14 WK US AND TRANSVAGINAL OB US TECHNIQUE: Both transabdominal and transvaginal ultrasound examinations were performed for complete evaluation of the gestation as well as the maternal uterus, adnexal regions, and pelvic cul-de-sac. Transvaginal technique was performed to assess early pregnancy. COMPARISON:  None. FINDINGS: Intrauterine gestational sac: Single Yolk sac:  Present Embryo:  Present Cardiac Activity: Present Heart Rate: 124  bpm CRL:  7.1  mm   6 w   4 d                  US EDC: 02/22/2016 Subchorionic hemorrhage:  None visualized. Maternal uterus/adnexae: Bilateral ovaries are within normal limits. Trace pelvic fluid, physiologic. IMPRESSION: Single live intrauterine gestation with estimated gestational age [redacted] weeks 4 days by crown-rump length.  Electronically Signed   By: Charline BillsSriyesh  Krishnan M.D.   On: 07/03/2015 21:31   [place] Ob Transvaginal  07/03/2015  CLINICAL DATA:  Pregnant, abdominal pain EXAM: OBSTETRIC <14 WK US AND TRANSVAGINAL OB US TECHNIQUE: Both transabdominal and transvaginal ultrasound examinations were performed for complete evaluation of the gestation as well as the maternal uterus, adnexal regions, and pelvic cul-de-sac. Transvaginal technique was  performed to assess early pregnancy. COMPARISON:  None. FINDINGS: Intrauterine gestational sac: Single Yolk sac:  Present Embryo:  Present Cardiac Activity: Present Heart Rate: 124  bpm CRL:  7.1  mm   6 w   4 d                  [place] EDC: 02/22/2016 Subchorionic hemorrhage:  None visualized. Maternal uterus/adnexae: Bilateral ovaries are within normal limits. Trace pelvic fluid, physiologic. IMPRESSION: Single live intrauterine gestation with estimated gestational age [redacted] weeks 4 days by crown-rump length. Electronically Signed   By: Charline Bills M.D.   On: 07/03/2015 21:31    MAU Course  Procedures None  MDM +UPT UA, wet prep, GC/chlamydia, CBC, quant hCG, HIV, RPR and [place] today to rule out ectopic pregnancy  Assessment and Plan  A: SIUP at [redacted]w[redacted]d Bacterial vaginosis Abdominal pain in pregnancy, first trimester SIUP at BTL   P: Discharge home Rx for Metrogel given to patient  First trimester precautions discussed Patient advised to follow-up with OB provider of choice to start prenatal care Patient may return to MAU as needed or if her condition were to change or worsen   Marny Lowenstein, PA-C  07/03/2015, 10:15 PM

## 2015-07-03 NOTE — Discharge Instructions (Signed)
Bacterial Vaginosis °Bacterial vaginosis is an infection of the vagina. It happens when too many germs (bacteria) grow in the vagina. Having this infection puts you at risk for getting other infections from sex. Treating this infection can help lower your risk for other infections, such as:  °· Chlamydia. °· Gonorrhea. °· HIV. °· Herpes. °HOME CARE °· Take your medicine as told by your doctor. °· Finish your medicine even if you start to feel better. °· Tell your sex partner that you have an infection. They should see their doctor for treatment. °· During treatment: °¨ Avoid sex or use condoms correctly. °¨ Do not douche. °¨ Do not drink alcohol unless your doctor tells you it is ok. °¨ Do not breastfeed unless your doctor tells you it is ok. °GET HELP IF: °· You are not getting better after 3 days of treatment. °· You have more grey fluid (discharge) coming from your vagina than before. °· You have more pain than before. °· You have a fever. °MAKE SURE YOU:  °· Understand these instructions. °· Will watch your condition. °· Will get help right away if you are not doing well or get worse. °  °This information is not intended to replace advice given to you by your health care provider. Make sure you discuss any questions you have with your health care provider. °  °Document Released: 12/12/2007 Document Revised: 03/25/2014 Document Reviewed: 10/14/2012 °Elsevier Interactive Patient Education ©2016 [employer] Inc[employer] °First Trimester of Pregnancy °The first trimester of pregnancy is from week 1 until the end of week 12 (months 1 through 3). During this time, your baby will begin to develop inside you. At 6-8 weeks, the eyes and face are formed, and the heartbeat can be seen on ultrasound. At the end of 12 weeks, all the baby's organs are formed. Prenatal care is all the medical care you receive before the birth of your baby. Make sure you get good prenatal care and follow all of your doctor's instructions. °HOME CARE    °Medicines °· Take medicine only as told by your doctor. Some medicines are safe and some are not during pregnancy. °· Take your prenatal vitamins as told by your doctor. °· Take medicine that helps you poop (stool softener) as needed if your doctor says it is okay. °Diet °· Eat regular, healthy meals. °· Your doctor will tell you the amount of weight gain that is right for you. °· Avoid raw meat and uncooked cheese. °· If you feel sick to your stomach (nauseous) or throw up (vomit): °¨ Eat 4 or 5 small meals a day instead of 3 large meals. °¨ Try eating a few soda crackers. °¨ Drink liquids between meals instead of during meals. °· If you have a hard time pooping (constipation): °¨ Eat high-fiber foods like fresh vegetables, fruit, and whole grains. °¨ Drink enough fluids to keep your pee (urine) clear or pale yellow. °Activity and Exercise °· Exercise only as told by your doctor. Stop exercising if you have cramps or pain in your lower belly (abdomen) or low back. °· Try to avoid standing for long periods of time. Move your legs often if you must stand in one place for a long time. °· Avoid heavy lifting. °· Wear low-heeled shoes. Sit and stand up straight. °· You can have sex unless your doctor tells you not to. °Relief of Pain or Discomfort °· Wear a good support bra if your breasts are sore. °· Take warm water baths (sitz baths)   to soothe pain or discomfort caused by hemorrhoids. Use hemorrhoid cream if your doctor says it is okay. °· Rest with your legs raised if you have leg cramps or low back pain. °· Wear support hose if you have puffy, bulging veins (varicose veins) in your legs. Raise (elevate) your feet for 15 minutes, 3-4 times a day. Limit salt in your diet. °Prenatal Care °· Schedule your prenatal visits by the twelfth week of pregnancy. °· Write down your questions. Take them to your prenatal visits. °· Keep all your prenatal visits as told by your doctor. °Safety °· Wear your seat belt at all times  when driving. °· Make a list of emergency phone numbers. The list should include numbers for family, friends, the hospital, and police and fire departments. °General Tips °· Ask your doctor for a referral to a local prenatal class. Begin classes no later than at the start of month 6 of your pregnancy. °· Ask for help if you need counseling or help with nutrition. Your doctor can give you advice or tell you where to go for help. °· Do not use hot tubs, steam rooms, or saunas. °· Do not douche or use tampons or scented sanitary pads. °· Do not cross your legs for long periods of time. °· Avoid litter boxes and soil used by cats. °· Avoid all smoking, herbs, and alcohol. Avoid drugs not approved by your doctor. °· Do not use any tobacco products, including cigarettes, chewing tobacco, and electronic cigarettes. If you need help quitting, ask your doctor. You may get counseling or other support to help you quit. °· Visit your dentist. At home, brush your teeth with a soft toothbrush. Be gentle when you floss. °GET HELP IF: °· You are dizzy. °· You have mild cramps or pressure in your lower belly. °· You have a nagging pain in your belly area. °· You continue to feel sick to your stomach, throw up, or have watery poop (diarrhea). °· You have a bad smelling fluid coming from your vagina. °· You have pain with peeing (urination). °· You have increased puffiness (swelling) in your face, hands, legs, or ankles. °GET HELP RIGHT AWAY IF:  °· You have a fever. °· You are leaking fluid from your vagina. °· You have spotting or bleeding from your vagina. °· You have very bad belly cramping or pain. °· You gain or lose weight rapidly. °· You throw up blood. It may look like coffee grounds. °· You are around people who have German measles, fifth disease, or chickenpox. °· You have a very bad headache. °· You have shortness of breath. °· You have any kind of trauma, such as from a fall or a car accident. °  °This information is not  intended to replace advice given to you by your health care provider. Make sure you discuss any questions you have with your health care provider. °  °Document Released: 08/21/2007 Document Revised: 03/25/2014 Document Reviewed: 01/12/2013 °Elsevier Interactive Patient Education ©2016 Elsevier Inc. ° °

## 2015-07-04 LAB — GC/CHLAMYDIA PROBE AMP (~~LOC~~) NOT AT ARMC
Chlamydia: NEGATIVE
Neisseria Gonorrhea: NEGATIVE

## 2015-07-04 LAB — HIV ANTIBODY (ROUTINE TESTING W REFLEX): HIV Screen 4th Generation wRfx: NONREACTIVE

## 2015-07-04 LAB — RPR: RPR Ser Ql: NONREACTIVE

## 2015-07-16 ENCOUNTER — Inpatient Hospital Stay (HOSPITAL_COMMUNITY): Payer: Medicaid Other

## 2015-07-16 ENCOUNTER — Inpatient Hospital Stay (HOSPITAL_COMMUNITY)
Admission: AD | Admit: 2015-07-16 | Discharge: 2015-07-16 | Disposition: A | Discharge status: Home / Self Care | Payer: Medicaid Other | Source: Ambulatory Visit | Attending: Obstetrics and Gynecology | Admitting: Obstetrics and Gynecology

## 2015-07-16 ENCOUNTER — Encounter (HOSPITAL_COMMUNITY): Payer: Self-pay | Admitting: *Deleted

## 2015-07-16 DIAGNOSIS — O99611 Diseases of the digestive system complicating pregnancy, first trimester: Secondary | ICD-10-CM | POA: Diagnosis not present

## 2015-07-16 DIAGNOSIS — Z3A01 Less than 8 weeks gestation of pregnancy: Secondary | ICD-10-CM | POA: Insufficient documentation

## 2015-07-16 DIAGNOSIS — O26891 Other specified pregnancy related conditions, first trimester: Secondary | ICD-10-CM | POA: Diagnosis not present

## 2015-07-16 DIAGNOSIS — R109 Unspecified abdominal pain: Secondary | ICD-10-CM | POA: Diagnosis present

## 2015-07-16 DIAGNOSIS — Z91013 Allergy to seafood: Secondary | ICD-10-CM | POA: Insufficient documentation

## 2015-07-16 DIAGNOSIS — Z3A08 8 weeks gestation of pregnancy: Secondary | ICD-10-CM | POA: Diagnosis not present

## 2015-07-16 DIAGNOSIS — O26899 Other specified pregnancy related conditions, unspecified trimester: Secondary | ICD-10-CM

## 2015-07-16 DIAGNOSIS — O99331 Smoking (tobacco) complicating pregnancy, first trimester: Secondary | ICD-10-CM | POA: Diagnosis not present

## 2015-07-16 DIAGNOSIS — F17210 Nicotine dependence, cigarettes, uncomplicated: Secondary | ICD-10-CM | POA: Insufficient documentation

## 2015-07-16 DIAGNOSIS — K529 Noninfective gastroenteritis and colitis, unspecified: Secondary | ICD-10-CM

## 2015-07-16 LAB — CBC WITH DIFFERENTIAL/PLATELET
BASOS ABS: 0 10*3/uL (ref 0.0–0.1)
Basophils Relative: 0 %
EOS PCT: 0 %
Eosinophils Absolute: 0 10*3/uL (ref 0.0–0.7)
HCT: 37.9 % (ref 36.0–46.0)
Hemoglobin: 13.4 g/dL (ref 12.0–15.0)
LYMPHS PCT: 23 %
Lymphs Abs: 2.5 10*3/uL (ref 0.7–4.0)
MCH: 29.9 pg (ref 26.0–34.0)
MCHC: 35.4 g/dL (ref 30.0–36.0)
MCV: 84.6 fL (ref 78.0–100.0)
MONO ABS: 0.4 10*3/uL (ref 0.1–1.0)
Monocytes Relative: 4 %
Neutro Abs: 7.9 10*3/uL — ABNORMAL HIGH (ref 1.7–7.7)
Neutrophils Relative %: 73 %
PLATELETS: 278 10*3/uL (ref 150–400)
RBC: 4.48 MIL/uL (ref 3.87–5.11)
RDW: 13.2 % (ref 11.5–15.5)
WBC: 10.9 10*3/uL — ABNORMAL HIGH (ref 4.0–10.5)

## 2015-07-16 LAB — URINALYSIS, ROUTINE W REFLEX MICROSCOPIC
Bilirubin Urine: NEGATIVE
GLUCOSE, UA: NEGATIVE mg/dL
HGB URINE DIPSTICK: NEGATIVE
KETONES UR: NEGATIVE mg/dL
Leukocytes, UA: NEGATIVE
Nitrite: NEGATIVE
PROTEIN: NEGATIVE mg/dL
Specific Gravity, Urine: 1.015 (ref 1.005–1.030)
pH: 7 (ref 5.0–8.0)

## 2015-07-16 NOTE — Discharge Instructions (Signed)
Viral Gastroenteritis °Viral gastroenteritis is also known as stomach flu. This condition affects the stomach and intestinal tract. It can cause sudden diarrhea and vomiting. The illness typically lasts 3 to 8 days. Most people develop an immune response that eventually gets rid of the virus. While this natural response develops, the virus can make you quite ill. °CAUSES  °Many different viruses can cause gastroenteritis, such as rotavirus or noroviruses. You can catch one of these viruses by consuming contaminated food or water. You may also catch a virus by sharing utensils or other personal items with an infected person or by touching a contaminated surface. °SYMPTOMS  °The most common symptoms are diarrhea and vomiting. These problems can cause a severe loss of body fluids (dehydration) and a body salt (electrolyte) imbalance. Other symptoms may include: °· Fever. °· Headache. °· Fatigue. °· Abdominal pain. °DIAGNOSIS  °Your caregiver can usually diagnose viral gastroenteritis based on your symptoms and a physical exam. A stool sample may also be taken to test for the presence of viruses or other infections. °TREATMENT  °This illness typically goes away on its own. Treatments are aimed at rehydration. The most serious cases of viral gastroenteritis involve vomiting so severely that you are not able to keep fluids down. In these cases, fluids must be given through an intravenous line (IV). °HOME CARE INSTRUCTIONS  °· Drink enough fluids to keep your urine clear or pale yellow. Drink small amounts of fluids frequently and increase the amounts as tolerated. °· Ask your caregiver for specific rehydration instructions. °· Avoid: °¨ Foods high in sugar. °¨ Alcohol. °¨ Carbonated drinks. °¨ Tobacco. °¨ Juice. °¨ Caffeine drinks. °¨ Extremely hot or cold fluids. °¨ Fatty, greasy foods. °¨ Too much intake of anything at one time. °¨ Dairy products until 24 to 48 hours after diarrhea stops. °· You may consume probiotics.  Probiotics are active cultures of beneficial bacteria. They may lessen the amount and number of diarrheal stools in adults. Probiotics can be found in yogurt with active cultures and in supplements. °· Wash your hands well to avoid spreading the virus. °· Only take over-the-counter or prescription medicines for pain, discomfort, or fever as directed by your caregiver. Do not give aspirin to children. Antidiarrheal medicines are not recommended. °· Ask your caregiver if you should continue to take your regular prescribed and over-the-counter medicines. °· Keep all follow-up appointments as directed by your caregiver. °SEEK IMMEDIATE MEDICAL CARE IF:  °· You are unable to keep fluids down. °· You do not urinate at least once every 6 to 8 hours. °· You develop shortness of breath. °· You notice blood in your stool or vomit. This may look like coffee grounds. °· You have abdominal pain that increases or is concentrated in one small area (localized). °· You have persistent vomiting or diarrhea. °· You have a fever. °· The patient is a child younger than 3 months, and he or she has a fever. °· The patient is a child older than 3 months, and he or she has a fever and persistent symptoms. °· The patient is a child older than 3 months, and he or she has a fever and symptoms suddenly get worse. °· The patient is a baby, and he or she has no tears when crying. °MAKE SURE YOU:  °· Understand these instructions. °· Will watch your condition. °· Will get help right away if you are not doing well or get worse. °  °This information is not intended to replace   advice given to you by your health care provider. Make sure you discuss any questions you have with your health care provider.   Document Released: 03/04/2005 Document Revised: 05/27/2011 Document Reviewed: 12/19/2010 Elsevier Interactive Patient Education 2016 Elsevier Inc. Abdominal Pain During Pregnancy Belly (abdominal) pain is common during pregnancy. Most of the  time, it is not a serious problem. Other times, it can be a sign that something is wrong with the pregnancy. Always tell your doctor if you have belly pain. HOME CARE Monitor your belly pain for any changes. The following actions may help you feel better:  Do not have sex (intercourse) or put anything in your vagina until you feel better.  Rest until your pain stops.  Drink clear fluids if you feel sick to your stomach (nauseous). Do not eat solid food until you feel better.  Only take medicine as told by your doctor.  Keep all doctor visits as told. GET HELP RIGHT AWAY IF:   You are bleeding, leaking fluid, or pieces of tissue come out of your vagina.  You have more pain or cramping.  You keep throwing up (vomiting).  You have pain when you pee (urinate) or have blood in your pee.  You have a fever.  You do not feel your baby moving as much.  You feel very weak or feel like passing out.  You have trouble breathing, with or without belly pain.  You have a very bad headache and belly pain.  You have fluid leaking from your vagina and belly pain.  You keep having watery poop (diarrhea).  Your belly pain does not go away after resting, or the pain gets worse. MAKE SURE YOU:   Understand these instructions.  Will watch your condition.  Will get help right away if you are not doing well or get worse.   This information is not intended to replace advice given to you by your health care provider. Make sure you discuss any questions you have with your health care provider.   Document Released: 02/20/2009 Document Revised: 11/04/2012 Document Reviewed: 10/01/2012 Elsevier Interactive Patient Education [employer]2016 Else[employer] Inc.

## 2015-07-16 NOTE — MAU Note (Signed)
Pt states she has really bad abdominal pain that started three weeks ago.  Pt states she started throwing up about a week ago.

## 2015-07-16 NOTE — MAU Provider Note (Signed)
History     CSN: [id number]  Arrival date and time: 07/16/15 1458   First Provider Initiated Contact with Patient 07/16/15 1557      Chief Complaint  Patient presents with  . Abdominal Pain  . Emesis   HPI Dana Cole is 35 y.o. [id number] [redacted]w[redacted]d weeks presenting with abdominal pain X 3 weeks.  Associated nausea and vomiting that began 1 week. Diarrhea X 3 since last night.  Temp 99.2. On enteric precautions.   Neg for fever or chills at home.  Neg for N&V. She was seen 4/17 for same complaint.  On that visit + UPT, Dx of BV/Rx for Metrogel, which she is using.  Hx of BTL-- by 4/17  U/S--Viable IUP [redacted]w[redacted]d without SCH. Tylenol helps a little.  Denies vaginal bleeding.  Has appt with [facility] to begin prenatal care on Friday.   Past Medical History  Diagnosis Date  . Medical history non-contributory     Past Surgical History  Procedure Laterality Date  . Joint replacement    . Hip surgery    . Tubal ligation      Family History  Problem Relation Age of Onset  . Hypertension Other   . Diabetes Other     Social History  Substance Use Topics  . Smoking status: Current Some Day Smoker    Types: Cigarettes  . Smokeless tobacco: None  . Alcohol Use: Yes     Comment: occassional     Allergies:  Allergies  Allergen Reactions  . Shellfish Allergy Itching and Swelling    Prescriptions prior to admission  Medication Sig Dispense Refill Last Dose  . acetaminophen (TYLENOL) 325 MG tablet Take 650 mg by mouth every 6 (six) hours as needed for moderate pain.   Past Week at Unknown time  . metroNIDAZOLE (METROGEL VAGINAL) 0.75 % vaginal gel Place 1 Applicatorful vaginally 2 (two) times daily. 70 g 0 07/15/2015 at Unknown time    Review of Systems  Constitutional: Negative for fever and chills.  Gastrointestinal: Positive for abdominal pain and diarrhea. Negative for nausea, vomiting and constipation.  Genitourinary: Negative for dysuria, urgency, frequency and hematuria.       Neg  for vaginal discharge and bleeding.  Neurological: Negative for headaches.   Physical Exam   Blood pressure 126/80, pulse 84, temperature 99.2 F (37.3 C), temperature source Oral, resp. rate 18, height  (1.702 m), weight 271 lb 6.4 oz (123.106 kg), last menstrual period 05/22/2015.  Physical Exam  Constitutional: She appears well-developed and well-nourished. No distress.  HENT:  Head: Normocephalic.  Neck: Normal range of motion.  Cardiovascular: Normal rate.   Respiratory: Effort normal.  GI: She exhibits no distension. There is tenderness. There is no rebound and no guarding.  Genitourinary: There is no rash, tenderness or lesion on the right labia. There is no rash, tenderness or lesion on the left labia. Uterus is enlarged (7-8 week size.). Uterus is not tender. Cervix exhibits no motion tenderness, no discharge and no friability. Right adnexum displays no mass, no tenderness and no fullness. Left adnexum displays no mass, no tenderness and no fullness. No erythema, tenderness or bleeding in the vagina. No vaginal discharge found.     Results for orders placed or performed during the hospital encounter of 07/16/15 (from the past 24 hour(s))  Urinalysis, Routine w reflex microscopic (not at [facility])     Status: None   Collection Time: 07/16/15  3:15 PM  Result Value Ref Range   Color,  Urine YELLOW YELLOW   APPearance CLEAR CLEAR   Specific Gravity, Urine 1.015 1.005 - 1.030   pH 7.0 5.0 - 8.0   Glucose, UA NEGATIVE NEGATIVE mg/dL   Hgb urine dipstick NEGATIVE NEGATIVE   Bilirubin Urine NEGATIVE NEGATIVE   Ketones, ur NEGATIVE NEGATIVE mg/dL   Protein, ur NEGATIVE NEGATIVE mg/dL   Nitrite NEGATIVE NEGATIVE   Leukocytes, UA NEGATIVE NEGATIVE  CBC with Differential/Platelet     Status: Abnormal   Collection Time: 07/16/15  4:10 PM  Result Value Ref Range   WBC 10.9 (H) 4.0 - 10.5 K/uL   RBC 4.48 3.87 - 5.11 MIL/uL   Hemoglobin 13.4 12.0 - 15.0 g/dL   HCT 40.937.9 81.136.0 - 91.446.0  %   MCV 84.6 78.0 - 100.0 fL   MCH 29.9 26.0 - 34.0 pg   MCHC 35.4 30.0 - 36.0 g/dL   RDW 78.213.2 95.611.5 - 21.315.5 %   Platelets 278 150 - 400 K/uL   Neutrophils Relative % 73 %   Neutro Abs 7.9 (H) 1.7 - 7.7 K/uL   Lymphocytes Relative 23 %   Lymphs Abs 2.5 0.7 - 4.0 K/uL   Monocytes Relative 4 %   Monocytes Absolute 0.4 0.1 - 1.0 K/uL   Eosinophils Relative 0 %   Eosinophils Absolute 0.0 0.0 - 0.7 K/uL   Basophils Relative 0 %   Basophils Absolute 0.0 0.0 - 0.1 K/uL   BLOOD TYPE from previous record. AB Positive  [place] Ob Transvaginal  07/16/2015  CLINICAL DATA:  35 year old pregnant female with history of worsening abdominal and pelvic pain. EXAM: TRANSVAGINAL OB ULTRASOUND TECHNIQUE: Transvaginal ultrasound was performed for complete evaluation of the gestation as well as the maternal uterus, adnexal regions, and pelvic cul-de-sac. COMPARISON:  Pelvic ultrasound 07/03/2015. FINDINGS: Intrauterine gestational sac: Single. Yolk sac:  Present. Embryo:  Present. Cardiac Activity: Present. Heart Rate: 166 Bpm CRL:   20.6  mm   8 w 5 d                  US EDC: 02/20/2016 Subchorionic hemorrhage:  None visualized. Maternal uterus/adnexae: Bilateral ovaries are normal in appearance. No significant free fluid in the cul-de-sac. IMPRESSION: 1. Single viable IUP with estimated gestational age of [redacted] weeks and 5 days and normal fetal heart rate of 166 beats per minute. Electronically Signed   By: Trudie Reedaniel  Entrikin M.D.   On: 07/16/2015 17:15   MAU Course  Procedures  MDM MSE Labs Exam US Reviewed labs and U/S with patient. Assessment and Plan  A:  Abdominal pain at 4348w6d gestation     Diarrhea     Gastroenteritis  P: Encourage continue tylenol for abdominal discomfort      Bland diet       Return for worsening sxs.    KEY,EVE M 07/16/2015, 5:28 PM

## 2015-08-02 ENCOUNTER — Encounter: Payer: Self-pay | Admitting: Hematology and Oncology

## 2015-08-02 ENCOUNTER — Telehealth: Payer: Self-pay | Admitting: Hematology and Oncology

## 2015-08-02 NOTE — Telephone Encounter (Signed)
Not[facility] Sher[facility] of the patient's appointment on 5/22 at 10:30 with Dr. Bertis RuddyGorsuch. Office will notify the patient of the appointment.

## 2015-08-07 ENCOUNTER — Encounter: Payer: Self-pay | Admitting: Hematology and Oncology

## 2015-08-07 ENCOUNTER — Ambulatory Visit (HOSPITAL_BASED_OUTPATIENT_CLINIC_OR_DEPARTMENT_OTHER): Payer: Medicaid Other | Admitting: Hematology and Oncology

## 2015-08-07 ENCOUNTER — Telehealth: Payer: Self-pay | Admitting: Hematology and Oncology

## 2015-08-07 ENCOUNTER — Telehealth: Payer: Self-pay | Admitting: *Deleted

## 2015-08-07 ENCOUNTER — Other Ambulatory Visit (HOSPITAL_BASED_OUTPATIENT_CLINIC_OR_DEPARTMENT_OTHER): Payer: Medicaid Other

## 2015-08-07 VITALS — BP 129/69 | HR 102 | Temp 98.7°F | Resp 20 | Ht 69.0 in | Wt 276.5 lb

## 2015-08-07 DIAGNOSIS — D72829 Elevated white blood cell count, unspecified: Secondary | ICD-10-CM

## 2015-08-07 HISTORY — DX: Elevated white blood cell count, unspecified: D72.829

## 2015-08-07 LAB — CBC WITH DIFFERENTIAL/PLATELET
BASO%: 0.1 % (ref 0.0–2.0)
Basophils Absolute: 0 10*3/uL (ref 0.0–0.1)
EOS%: 0.5 % (ref 0.0–7.0)
Eosinophils Absolute: 0.1 10*3/uL (ref 0.0–0.5)
HCT: 35.3 % (ref 34.8–46.6)
HGB: 12.2 g/dL (ref 11.6–15.9)
LYMPH%: 21 % (ref 14.0–49.7)
MCH: 29.6 pg (ref 25.1–34.0)
MCHC: 34.6 g/dL (ref 31.5–36.0)
MCV: 85.7 fL (ref 79.5–101.0)
MONO#: 0.5 10*3/uL (ref 0.1–0.9)
MONO%: 5.4 % (ref 0.0–14.0)
NEUT#: 7.4 10*3/uL — ABNORMAL HIGH (ref 1.5–6.5)
NEUT%: 73 % (ref 38.4–76.8)
PLATELETS: 231 10*3/uL (ref 145–400)
RBC: 4.12 10*6/uL (ref 3.70–5.45)
RDW: 13 % (ref 11.2–14.5)
WBC: 10.1 10*3/uL (ref 3.9–10.3)
lymph#: 2.1 10*3/uL (ref 0.9–3.3)

## 2015-08-07 LAB — CHCC SMEAR

## 2015-08-07 NOTE — Progress Notes (Signed)
[facility] CONSULT NOTE  Patient Care Team: Fleet Contras, MD as PCP - General (Internal Medicine) Jaymes Graff, MD as Consulting Physician (Obstetrics and Gynecology)  CHIEF COMPLAINTS/PURPOSE OF CONSULTATION:  Abnormal CBC  HISTORY OF PRESENTING ILLNESS:  Dana Cole 35 y.o. female is here because of abnormal WBC ordered on 07/21/2015. CBC show white count 9.6, hemoglobin 12.8 template count 272,000. Atypical lymphocytes were seen by the technician. HIV testing was negative She was found to have abnormal CBC from recent routine follow-up with her OB/GYN The patient is currently pregnant with pregnancy #6. She has one remote history of miscarriage. She lived with her other 4 children at home Expected due date is 02/26/2016 She had recent infection with vaginal discharge and was prescribed metronidazole gel which was subsequently switched to oral metronidazole  There is not reported symptoms of sinus congestion, cough, urinary frequency/urgency or dysuria, diarrhea, joint swelling/pain or abnormal skin rash.  She had no prior history or diagnosis of cancer. Her age appropriate screening programs are up-to-date. The patient has no prior diagnosis of autoimmune disease and was not prescribed corticosteroids related products.  The patient was a smoker but quit smoking 2 months ago.  MEDICAL HISTORY:  Past Medical History  Diagnosis Date  . Medical history non-contributory   . Leukocytosis 08/07/2015    SURGICAL HISTORY: Past Surgical History  Procedure Laterality Date  . Joint replacement    . Hip surgery    . Tubal ligation      SOCIAL HISTORY: Social History   Social History  . Marital Status: Single    Spouse Name: N/A  . Number of Children: N/A  . Years of Education: N/A   Occupational History  . Not on file.   Social History Main Topics  . Smoking status: Former Smoker    Types: Cigarettes    Quit date: 06/07/2015  . Smokeless tobacco: Never  Used  . Alcohol Use: Yes     Comment: occassional   . 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 Sexual Activity: No     Comment: liives with 4 children, works at [employer]   Other Topics Concern  . Not on file   Social History Narrative    FAMILY HISTORY: Family History  Problem Relation Age of Onset  . Hypertension Other   . Diabetes Other     ALLERGIES:  is allergic to shellfish allergy.  MEDICATIONS:  Current Outpatient Prescriptions  Medication Sig Dispense Refill  . acetaminophen (TYLENOL) 325 MG tablet Take 650 mg by mouth every 6 (six) hours as needed for moderate pain.    . metroNIDAZOLE (METROGEL VAGINAL) 0.75 % vaginal gel Place 1 Applicatorful vaginally 2 (two) times daily. 70 g 0  . Prenatal Vit-Fe Phos-FA-Omega (VITAFOL GUMMIES) 3.33-0.333-34.8 MG CHEW Chew 3 each by mouth daily.  8   No current facility-administered medications for this visit.    REVIEW OF SYSTEMS:   Constitutional: Denies fevers, chills or abnormal night sweats Eyes: Denies blurriness of vision, double vision or watery eyes Ears, nose, mouth, throat, and face: Denies mucositis or sore throat Respiratory: Denies cough, dyspnea or wheezes Cardiovascular: Denies palpitation, chest discomfort or lower extremity swelling Gastrointestinal:  Denies nausea, heartburn or change in bowel habits Skin: Denies abnormal skin rashes Lymphatics: Denies new lymphadenopathy or easy bruising Neurological:Denies numbness, tingling or new weaknesses Behavioral/Psych: Mood is stable, no new changes  All other systems were reviewed with the patient and  are negative.  PHYSICAL EXAMINATION: ECOG PERFORMANCE STATUS: 0 - Asymptomatic  Filed Vitals:   08/07/15 1016  BP: 129/69  Pulse: 102  Temp: 98.7 F (37.1 C)  Resp: 20   Filed Weights   08/07/15 1016  Weight: 276 lb 8 oz (125.42 kg)    GENERAL:alert, no distress and comfortable. She is obese  SKIN: skin color, texture, turgor are  normal, no rashes or significant lesions EYES: normal, conjunctiva are pink and non-injected, sclera clear OROPHARYNX:no exudate, no erythema and lips, buccal mucosa, and tongue normal  NECK: supple, thyroid normal size, non-tender, without nodularity LYMPH:  no palpable lymphadenopathy in the cervical, axillary or inguinal LUNGS: clear to auscultation and percussion with normal breathing effort HEART: regular rate & rhythm and no murmurs and no lower extremity edema ABDOMEN:abdomen soft, non-tender and normal bowel sounds Musculoskeletal:no cyanosis of digits and no clubbing  PSYCH: alert & oriented x 3 with fluent speech NEURO: no focal motor/sensory deficits  LABORATORY DATA:  I have reviewed the data as listed Recent Results (from the past 2160 hour(s))  GC/Chlamydia probe amp ([place])not at [facility]     Status: None   Collection Time: 06/03/15 12:00 AM  Result Value Ref Range   Chlamydia Negative     Comment: Normal Reference Range - Negative   Neisseria gonorrhea Negative     Comment: Normal Reference Range - Negative  Wet prep, genital     Status: Abnormal   Collection Time: 06/03/15  7:15 PM  Result Value Ref Range   Yeast Wet Prep HPF POC NONE SEEN NONE SEEN   Trich, Wet Prep NONE SEEN NONE SEEN   Clue Cells Wet Prep HPF POC PRESENT (A) NONE SEEN   WBC, Wet Prep HPF POC MODERATE (A) NONE SEEN    Comment: MODERATE BACTERIA SEEN   Sperm NONE SEEN   Herpes simplex virus culture     Status: None   Collection Time: 06/03/15  7:15 PM  Result Value Ref Range   Specimen Description VAGINA    Special Requests Normal    Culture      No Herpes Simplex Virus detected. Performed at [facility]    Report Status 06/05/2015 FINAL   GC/Chlamydia probe amp ([place])     Status: None   Collection Time: 07/03/15 12:00 AM  Result Value Ref Range   Chlamydia Negative     Comment: Normal Reference Range - Negative   Neisseria gonorrhea Negative     Comment: Normal  Reference Range - Negative  Urinalysis, Routine w reflex microscopic (not at [facility])     Status: None   Collection Time: 07/03/15  8:14 PM  Result Value Ref Range   Color, Urine YELLOW YELLOW   APPearance CLEAR CLEAR   Specific Gravity, Urine 1.015 1.005 - 1.030   pH 5.5 5.0 - 8.0   Glucose, UA NEGATIVE NEGATIVE mg/dL   Hgb urine dipstick NEGATIVE NEGATIVE   Bilirubin Urine NEGATIVE NEGATIVE   Ketones, ur NEGATIVE NEGATIVE mg/dL   Protein, ur NEGATIVE NEGATIVE mg/dL   Nitrite NEGATIVE NEGATIVE   Leukocytes, UA NEGATIVE NEGATIVE    Comment: MICROSCOPIC NOT DONE ON URINES WITH NEGATIVE PROTEIN, BLOOD, LEUKOCYTES, NITRITE, OR GLUCOSE <1000 mg/dL.  Pregnancy, urine POC     Status: Abnormal   Collection Time: 07/03/15  8:24 PM  Result Value Ref Range   Preg Test, Ur POSITIVE (A) NEGATIVE    Comment:        THE SENSITIVITY OF THIS METHODOLOGY  IS >24 mIU/mL   CBC with Differential/Platelet     Status: Abnormal   Collection Time: 07/03/15  8:55 PM  Result Value Ref Range   WBC 10.5 4.0 - 10.5 K/uL   RBC 4.20 3.87 - 5.11 MIL/uL   Hemoglobin 12.4 12.0 - 15.0 g/dL   HCT 60.4 (L) 54.0 - 98.1 %   MCV 85.5 78.0 - 100.0 fL   MCH 29.5 26.0 - 34.0 pg   MCHC 34.5 30.0 - 36.0 g/dL   RDW 19.1 47.8 - 29.5 %   Platelets 221 150 - 400 K/uL   Neutrophils Relative % 62 %   Neutro Abs 6.5 1.7 - 7.7 K/uL   Lymphocytes Relative 31 %   Lymphs Abs 3.3 0.7 - 4.0 K/uL   Monocytes Relative 6 %   Monocytes Absolute 0.6 0.1 - 1.0 K/uL   Eosinophils Relative 1 %   Eosinophils Absolute 0.1 0.0 - 0.7 K/uL   Basophils Relative 0 %   Basophils Absolute 0.0 0.0 - 0.1 K/uL  hCG, quantitative, pregnancy     Status: Abnormal   Collection Time: 07/03/15  8:55 PM  Result Value Ref Range   hCG, Beta Chain, Quant, S 37314 (H) <5 mIU/mL    Comment:          GEST. AGE      CONC.  (mIU/mL)   <=1 WEEK        5 - 50     2 WEEKS       50 - 500     3 WEEKS       100 - 10,000     4 WEEKS     1,000 - 30,000     5 WEEKS      3,500 - 115,000   6-8 WEEKS     12,000 - 270,000    12 WEEKS     15,000 - 220,000        FEMALE AND NON-PREGNANT FEMALE:     LESS THAN 5 mIU/mL   RPR     Status: None   Collection Time: 07/03/15  8:55 PM  Result Value Ref Range   RPR Ser Ql Non Reactive Non Reactive    Comment: (NOTE) Performed At: [facility] [address] [place], [place] [id number] Mila Homer MD QI:[id number]   HIV antibody     Status: None   Collection Time: 07/03/15  8:55 PM  Result Value Ref Range   HIV Screen 4th Generation wRfx Non Reactive Non Reactive    Comment: (NOTE) Performed At: [facility] [address] [place], [place] [id number] Mila Homer MD OZ:[id number]   Wet prep, genital     Status: Abnormal   Collection Time: 07/03/15  9:55 PM  Result Value Ref Range   Yeast Wet Prep HPF POC NONE SEEN NONE SEEN   Trich, Wet Prep NONE SEEN NONE SEEN   Clue Cells Wet Prep HPF POC PRESENT (A) NONE SEEN   WBC, Wet Prep HPF POC MODERATE (A) NONE SEEN    Comment: MODERATE BACTERIA SEEN   Sperm NONE SEEN   Urinalysis, Routine w reflex microscopic (not at [facility])     Status: None   Collection Time: 07/16/15  3:15 PM  Result Value Ref Range   Color, Urine YELLOW YELLOW   APPearance CLEAR CLEAR   Specific Gravity, Urine 1.015 1.005 - 1.030   pH 7.0 5.0 - 8.0   Glucose, UA NEGATIVE NEGATIVE mg/dL   Hgb urine dipstick  NEGATIVE NEGATIVE   Bilirubin Urine NEGATIVE NEGATIVE   Ketones, ur NEGATIVE NEGATIVE mg/dL   Protein, ur NEGATIVE NEGATIVE mg/dL   Nitrite NEGATIVE NEGATIVE   Leukocytes, UA NEGATIVE NEGATIVE    Comment: MICROSCOPIC NOT DONE ON URINES WITH NEGATIVE PROTEIN, BLOOD, LEUKOCYTES, NITRITE, OR GLUCOSE <1000 mg/dL.  CBC with Differential/Platelet     Status: Abnormal   Collection Time: 07/16/15  4:10 PM  Result Value Ref Range   WBC 10.9 (H) 4.0 - 10.5 K/uL   RBC 4.48 3.87 - 5.11 MIL/uL   Hemoglobin 13.4 12.0 - 15.0 g/dL   HCT 16.1 09.6 - 04.5 %   MCV 84.6  78.0 - 100.0 fL   MCH 29.9 26.0 - 34.0 pg   MCHC 35.4 30.0 - 36.0 g/dL   RDW 40.9 81.1 - 91.4 %   Platelets 278 150 - 400 K/uL   Neutrophils Relative % 73 %   Neutro Abs 7.9 (H) 1.7 - 7.7 K/uL   Lymphocytes Relative 23 %   Lymphs Abs 2.5 0.7 - 4.0 K/uL   Monocytes Relative 4 %   Monocytes Absolute 0.4 0.1 - 1.0 K/uL   Eosinophils Relative 0 %   Eosinophils Absolute 0.0 0.0 - 0.7 K/uL   Basophils Relative 0 %   Basophils Absolute 0.0 0.0 - 0.1 K/uL  CBC with Differential/Platelet     Status: Abnormal   Collection Time: 08/07/15 10:59 AM  Result Value Ref Range   WBC 10.1 3.9 - 10.3 10e3/uL   NEUT# 7.4 (H) 1.5 - 6.5 10e3/uL   HGB 12.2 11.6 - 15.9 g/dL   HCT 78.2 95.6 - 21.3 %   Platelets 231 145 - 400 10e3/uL   MCV 85.7 79.5 - 101.0 fL   MCH 29.6 25.1 - 34.0 pg   MCHC 34.6 31.5 - 36.0 g/dL   RBC 0.86 5.78 - 4.69 10e6/uL   RDW 13.0 11.2 - 14.5 %   lymph# 2.1 0.9 - 3.3 10e3/uL   MONO# 0.5 0.1 - 0.9 10e3/uL   Eosinophils Absolute 0.1 0.0 - 0.5 10e3/uL   Basophils Absolute 0.0 0.0 - 0.1 10e3/uL   NEUT% 73.0 38.4 - 76.8 %   LYMPH% 21.0 14.0 - 49.7 %   MONO% 5.4 0.0 - 14.0 %   EOS% 0.5 0.0 - 7.0 %   BASO% 0.1 0.0 - 2.0 %  Smear     Status: None   Collection Time: 08/07/15 10:59 AM  Result Value Ref Range   Smear Result Smear Available    Peripheral smear did not reveal any atypical cells. Leukocytosis has resolved RADIOGRAPHIC STUDIES: I have personally reviewed the radiological images as listed and agreed with the findings in the report. US Ob Transvaginal  07/16/2015  CLINICAL DATA:  35 year old pregnant female with history of worsening abdominal and pelvic pain. EXAM: TRANSVAGINAL OB ULTRASOUND TECHNIQUE: Transvaginal ultrasound was performed for complete evaluation of the gestation as well as the maternal uterus, adnexal regions, and pelvic cul-de-sac. COMPARISON:  Pelvic ultrasound 07/03/2015. FINDINGS: Intrauterine gestational sac: Single. Yolk sac:  Present. Embryo:   Present. Cardiac Activity: Present. Heart Rate: 166 Bpm CRL:   20.6  mm   8 w 5 d                  [place] EDC: 02/20/2016 Subchorionic hemorrhage:  None visualized. Maternal uterus/adnexae: Bilateral ovaries are normal in appearance. No significant free fluid in the cul-de-sac. IMPRESSION: 1. Single viable IUP with estimated gestational age of [redacted] weeks and 5 days  and normal fetal heart rate of 166 beats per minute. Electronically Signed   By: Trudie Reedaniel  Entrikin M.D.   On: 07/16/2015 17:15    ASSESSMENT & PLAN  Leukocytosis This has resolved. Recent abnormal CBC could be related to infection. The patient is reassured. She does not need return appointment.

## 2015-08-07 NOTE — Telephone Encounter (Signed)
-----   Message from Artis DelayNi Gorsuch, MD sent at 08/07/2015  1:53 PM EDT ----- Regarding: CBC pls let her know results are normal

## 2015-08-07 NOTE — Assessment & Plan Note (Signed)
This has resolved. Recent abnormal CBC could be related to infection. The patient is reassured. She does not need return appointment.

## 2015-08-07 NOTE — Telephone Encounter (Signed)
Gave and printed avs °

## 2015-08-07 NOTE — Telephone Encounter (Signed)
Informed pt of labs wnl per Dr. Bertis RuddyGorsuch.  She verbalized understanding.

## 2015-08-22 ENCOUNTER — Encounter (HOSPITAL_COMMUNITY): Payer: Self-pay

## 2015-08-22 ENCOUNTER — Inpatient Hospital Stay (HOSPITAL_COMMUNITY)
Admission: AD | Admit: 2015-08-22 | Discharge: 2015-08-22 | Disposition: A | Discharge status: Home / Self Care | Payer: Medicaid Other | Source: Ambulatory Visit | Attending: Obstetrics and Gynecology | Admitting: Obstetrics and Gynecology

## 2015-08-22 DIAGNOSIS — O26851 Spotting complicating pregnancy, first trimester: Secondary | ICD-10-CM | POA: Diagnosis present

## 2015-08-22 DIAGNOSIS — Z91013 Allergy to seafood: Secondary | ICD-10-CM | POA: Diagnosis not present

## 2015-08-22 DIAGNOSIS — R12 Heartburn: Secondary | ICD-10-CM

## 2015-08-22 DIAGNOSIS — Z3A13 13 weeks gestation of pregnancy: Secondary | ICD-10-CM | POA: Insufficient documentation

## 2015-08-22 DIAGNOSIS — O26899 Other specified pregnancy related conditions, unspecified trimester: Secondary | ICD-10-CM

## 2015-08-22 DIAGNOSIS — O9989 Other specified diseases and conditions complicating pregnancy, childbirth and the puerperium: Secondary | ICD-10-CM

## 2015-08-22 DIAGNOSIS — O26892 Other specified pregnancy related conditions, second trimester: Secondary | ICD-10-CM

## 2015-08-22 DIAGNOSIS — Z87891 Personal history of nicotine dependence: Secondary | ICD-10-CM | POA: Diagnosis not present

## 2015-08-22 DIAGNOSIS — R109 Unspecified abdominal pain: Secondary | ICD-10-CM | POA: Diagnosis not present

## 2015-08-22 DIAGNOSIS — O26852 Spotting complicating pregnancy, second trimester: Secondary | ICD-10-CM

## 2015-08-22 LAB — CBC
HCT: 31.7 % — ABNORMAL LOW (ref 36.0–46.0)
HEMOGLOBIN: 11.1 g/dL — AB (ref 12.0–15.0)
MCH: 29.2 pg (ref 26.0–34.0)
MCHC: 35 g/dL (ref 30.0–36.0)
MCV: 83.4 fL (ref 78.0–100.0)
PLATELETS: 212 10*3/uL (ref 150–400)
RBC: 3.8 MIL/uL — ABNORMAL LOW (ref 3.87–5.11)
RDW: 13.2 % (ref 11.5–15.5)
WBC: 13.2 10*3/uL — ABNORMAL HIGH (ref 4.0–10.5)

## 2015-08-22 LAB — COMPREHENSIVE METABOLIC PANEL
ALK PHOS: 31 U/L — AB (ref 38–126)
ALT: 25 U/L (ref 14–54)
ANION GAP: 8 (ref 5–15)
AST: 19 U/L (ref 15–41)
Albumin: 3.5 g/dL (ref 3.5–5.0)
BUN: 10 mg/dL (ref 6–20)
CALCIUM: 8.7 mg/dL — AB (ref 8.9–10.3)
CO2: 22 mmol/L (ref 22–32)
CREATININE: 0.56 mg/dL (ref 0.44–1.00)
Chloride: 104 mmol/L (ref 101–111)
Glucose, Bld: 107 mg/dL — ABNORMAL HIGH (ref 65–99)
Potassium: 3 mmol/L — ABNORMAL LOW (ref 3.5–5.1)
SODIUM: 134 mmol/L — AB (ref 135–145)
Total Bilirubin: 0.8 mg/dL (ref 0.3–1.2)
Total Protein: 7.3 g/dL (ref 6.5–8.1)

## 2015-08-22 LAB — URINE MICROSCOPIC-ADD ON

## 2015-08-22 LAB — URINALYSIS, ROUTINE W REFLEX MICROSCOPIC
Bilirubin Urine: NEGATIVE
GLUCOSE, UA: NEGATIVE mg/dL
Ketones, ur: 40 mg/dL — AB
LEUKOCYTES UA: NEGATIVE
Nitrite: NEGATIVE
Protein, ur: NEGATIVE mg/dL
SPECIFIC GRAVITY, URINE: 1.025 (ref 1.005–1.030)
pH: 6 (ref 5.0–8.0)

## 2015-08-22 LAB — LIPASE, BLOOD: Lipase: 21 U/L (ref 11–51)

## 2015-08-22 LAB — WET PREP, GENITAL
Sperm: NONE SEEN
Trich, Wet Prep: NONE SEEN
YEAST WET PREP: NONE SEEN

## 2015-08-22 LAB — AMYLASE: AMYLASE: 42 U/L (ref 28–100)

## 2015-08-22 MED ORDER — GI COCKTAIL ~~LOC~~
30.0000 mL | Freq: Once | ORAL | Status: AC
  Administered 2015-08-22: 30 mL via ORAL
  Filled 2015-08-22: qty 30

## 2015-08-22 MED ORDER — ONDANSETRON 8 MG PO TBDP: 8.0000 mg | ORAL_TABLET | Freq: Once | ORAL | Status: DC

## 2015-08-22 MED ORDER — RANITIDINE HCL 150 MG PO CAPS: 150.0000 mg | ORAL_CAPSULE | Freq: Every day | ORAL | Status: DC

## 2015-08-22 NOTE — MAU Provider Note (Signed)
Chief Complaint: Abdominal Cramping   First Provider Initiated Contact with Patient 08/22/15 1927      SUBJECTIVE HPI: Dana Cole is a 35 y.o. [id number] at [redacted]w[redacted]d by LMP who presents to maternity admissions reporting abdominal pain and vaginal spotting starting today.  Abdominal pain is cramping, constant, 7/10 pain.  She tried 2 extra strength Tylenol which did not help.  She noticed light spotting when wiping this evening.  She also reports n/v, similar to previous pregnancies, with increase in vomiting to 4 times today.  She has not yet started prenatal care because she had an appointment but her provider no longer accepts Medicaid.  She has a new OB appointment on 6/21 with [facility].   She denies vaginal itching/burning, urinary symptoms, or fever/chills.     HPI  Past Medical History  Diagnosis Date  . Medical history non-contributory   . Leukocytosis 08/07/2015   Past Surgical History  Procedure Laterality Date  . Joint replacement    . Hip surgery    . Tubal ligation     Social History   Social History  . Marital Status: Single    Spouse Name: N/A  . Number of Children: N/A  . Years of Education: N/A   Occupational History  . Not on file.   Social History Main Topics  . Smoking status: Former Smoker    Types: Cigarettes    Quit date: 06/07/2015  . Smokeless tobacco: Never Used  . Alcohol Use: Yes     Comment: occassional   . 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 Sexual Activity: No     Comment: liives with 4 children, works at [employer]   Other Topics Concern  . Not on file   Social History Narrative   No current facility-administered medications on file prior to encounter.   Current Outpatient Prescriptions on File Prior to Encounter  Medication Sig Dispense Refill  . acetaminophen (TYLENOL) 325 MG tablet Take 650 mg by mouth every 6 (six) hours as needed for moderate pain.    . Prenatal Vit-Fe Phos-FA-Omega (VITAFOL GUMMIES)  3.33-0.333-34.8 MG CHEW Chew 3 each by mouth daily.  8  . metroNIDAZOLE (METROGEL VAGINAL) 0.75 % vaginal gel Place 1 Applicatorful vaginally 2 (two) times daily. (Patient not taking: Reported on 08/22/2015) 70 g 0   Allergies  Allergen Reactions  . Shellfish Allergy Itching and Swelling    ROS:  Review of Systems  Constitutional: Negative for fever, chills and fatigue.  Respiratory: Negative for shortness of breath.   Cardiovascular: Negative for chest pain.  Gastrointestinal: Positive for abdominal pain. Negative for nausea, vomiting, diarrhea and constipation.  Genitourinary: Positive for vaginal bleeding. Negative for dysuria, flank pain, vaginal discharge, difficulty urinating, vaginal pain and pelvic pain.  Neurological: Negative for dizziness and headaches.  Psychiatric/Behavioral: Negative.      I have reviewed patient's Past Medical Hx, Surgical Hx, Family Hx, Social Hx, medications and allergies.   Physical Exam  No data found.  Constitutional: Well-developed, well-nourished female in no acute distress.  Cardiovascular: normal rate Respiratory: normal effort GI: Abd soft, non-tender. Pos BS x 4 MS: Extremities nontender, no edema, normal ROM Neurologic: Alert and oriented x 4.  GU: Neg CVAT.  PELVIC EXAM: Cervix pink, visually closed, without lesion, scant light brown discharge, vaginal walls and external genitalia normal Bimanual exam: Cervix 0/long/high, firm, anterior, neg CMT, uterus nontender, nonenlarged, adnexa without tenderness, enlargement, or mass  FHT 160 by  doppler  LAB RESULTS Results for orders placed or performed during the hospital encounter of 08/22/15 (from the past 24 hour(s))  Urinalysis, Routine w reflex microscopic (not at [facility])     Status: Abnormal   Collection Time: 08/22/15  6:50 PM  Result Value Ref Range   Color, Urine YELLOW YELLOW   APPearance CLEAR CLEAR   Specific Gravity, Urine 1.025 1.005 - 1.030   pH 6.0 5.0 - 8.0   Glucose, UA  NEGATIVE NEGATIVE mg/dL   Hgb urine dipstick LARGE (A) NEGATIVE   Bilirubin Urine NEGATIVE NEGATIVE   Ketones, ur 40 (A) NEGATIVE mg/dL   Protein, ur NEGATIVE NEGATIVE mg/dL   Nitrite NEGATIVE NEGATIVE   Leukocytes, UA NEGATIVE NEGATIVE  Urine microscopic-add on     Status: Abnormal   Collection Time: 08/22/15  6:50 PM  Result Value Ref Range   Squamous Epithelial / LPF 0-5 (A) NONE SEEN   WBC, UA 0-5 0 - 5 WBC/hpf   RBC / HPF 6-30 0 - 5 RBC/hpf   Bacteria, UA FEW (A) NONE SEEN  Wet prep, genital     Status: Abnormal   Collection Time: 08/22/15  8:22 PM  Result Value Ref Range   Yeast Wet Prep HPF POC NONE SEEN NONE SEEN   Trich, Wet Prep NONE SEEN NONE SEEN   Clue Cells Wet Prep HPF POC PRESENT (A) NONE SEEN   WBC, Wet Prep HPF POC MODERATE (A) NONE SEEN   Sperm NONE SEEN        IMAGING No results found.  MAU Management/MDM: Ordered labs and reviewed results.  Scant bleeding on exam and normal FHT today.  No symptoms of vaginal infection so will not treat positive clue cells.    Likely GI pain/acid reflux.  GI cocktail in MAU improved symptoms slightly but pt vomited a few minutes after receiving medications.  Discussed dietary changes with pt and Rx for Zantac 150 mg BID.  [facility].  Pt stable at time of discharge.  ASSESSMENT 1. Abdominal pain affecting pregnancy, antepartum   2. Spotting affecting pregnancy in second trimester   3. Heartburn during pregnancy in second trimester, antepartum     PLAN Discharge home    Medication List    STOP taking these medications        metroNIDAZOLE 0.75 % vaginal gel  Commonly known as:  METROGEL VAGINAL      TAKE these medications        acetaminophen 325 MG tablet  Commonly known as:  TYLENOL  Take 650 mg by mouth every 6 (six) hours as needed for moderate pain.     ranitidine 150 MG capsule  Commonly known as:  ZANTAC  Take 1 capsule (150 mg total) by mouth daily.      VITAFOL GUMMIES 3.33-0.333-34.8 MG Chew  Chew 3 each by mouth daily.       Follow-up Information    Follow [facility] with [facility] [place].   Specialty:  Obstetrics and Gynecology   Why:  The office will call you with appointment., Return to MAU as needed for emergencies   Contact information:   [address], [address] [place] [id number] [phone]      Sharen CounterLisa Leftwich-Kirby Certified Nurse-Midwife 08/22/2015  9:41 PM

## 2015-08-22 NOTE — MAU Note (Signed)
Patient presents with cramping and spotting that started today.

## 2015-08-22 NOTE — Discharge Instructions (Signed)
Heartburn During Pregnancy Heartburn is a burning sensation in the chest caused by stomach acid backing up into the esophagus. Heartburn is common in pregnancy because a certain hormone (progesterone) is released when a woman is pregnant. The progesterone hormone may relax the valve that separates the esophagus from the stomach. This allows acid to go up into the esophagus, causing heartburn. Heartburn may also happen in pregnancy because the enlarging uterus pushes up on the stomach, which pushes more acid into the esophagus. This is especially true in the later stages of pregnancy. Heartburn problems usually go away after giving birth. CAUSES  Heartburn is caused by stomach acid backing up into the esophagus. During pregnancy, this may result from various things, including:   The progesterone hormone.  Changing hormone levels.  The growing uterus pushing stomach acid upward.  Large meals.  Certain foods and drinks.  Exercise.  Increased acid production. SIGNS AND SYMPTOMS   Burning pain in the chest or lower throat.  Bitter taste in the mouth.  Coughing. DIAGNOSIS  Your health care provider will typically diagnose heartburn by taking a careful history of your concern. Blood tests may be done to check for a certain type of bacteria that is associated with heartburn. Sometimes, heartburn is diagnosed by prescribing a heartburn medicine to see if the symptoms improve. In some cases, a procedure called an endoscopy may be done. In this procedure, a tube with a light and a camera on the end (endoscope) is used to examine the esophagus and the stomach. TREATMENT  Treatment will vary depending on the severity of your symptoms. Your health care provider may recommend:  Over-the-counter medicines (antacids, acid reducers) for mild heartburn.  Prescription medicines to decrease stomach acid or to protect your stomach lining.  Certain changes in your diet.  Elevating the head of your bed  by putting blocks under the legs. This helps prevent stomach acid from backing up into the esophagus when you are lying down. HOME CARE INSTRUCTIONS   Only take over-the-counter or prescription medicines as directed by your health care provider.  Raise the head of your bed by putting blocks under the legs if instructed to do so by your health care provider. Sleeping with more pillows is not effective because it only changes the position of your head.  Do not exercise right after eating.  Avoid eating 2-3 hours before bed. Do not lie down right after eating.  Eat small meals throughout the day instead of three large meals.  Identify foods and beverages that make your symptoms worse and avoid them. Foods you may want to avoid include:  Peppers.  Chocolate.  High-fat foods, including fried foods.  Spicy foods.  Garlic and onions.  Citrus fruits, including oranges, grapefruit, lemons, and limes.  Food containing tomatoes or tomato products.  Mint.  Carbonated and caffeinated drinks.  Vinegar. SEEK MEDICAL CARE IF:  You have abdominal pain of any kind.  You feel burning in your upper abdomen or chest, especially after eating or lying down.  You have nausea and vomiting.  Your stomach feels upset after you eat. SEEK IMMEDIATE MEDICAL CARE IF:   You have severe chest pain that goes down your arm or into your jaw or neck.  You feel sweaty, dizzy, or light-headed.  You become short of breath.  You vomit blood.  You have difficulty or pain with swallowing.  You have bloody or black, tarry stools.  You have episodes of heartburn more than 3 times a week,  for more than 2 weeks. MAKE SURE YOU:  Understand these instructions.  Will watch your condition.  Will get help right away if you are not doing well or get worse.   This information is not intended to replace advice given to you by your health care provider. Make sure you discuss any questions you have with  your health care provider.   Document Released: 03/01/2000 Document Revised: 03/25/2014 Document Reviewed: 10/21/2012 Elsevier Interactive Patient Education 2016 Elsevier Inc.   Abdominal Pain During Pregnancy Abdominal pain is common in pregnancy. Most of the time, it does not cause harm. There are many causes of abdominal pain. Some causes are more serious than others. Some of the causes of abdominal pain in pregnancy are easily diagnosed. Occasionally, the diagnosis takes time to understand. Other times, the cause is not determined. Abdominal pain can be a sign that something is very wrong with the pregnancy, or the pain may have nothing to do with the pregnancy at all. For this reason, always tell your health care provider if you have any abdominal discomfort. HOME CARE INSTRUCTIONS  Monitor your abdominal pain for any changes. The following actions may help to alleviate any discomfort you are experiencing:  Do not have sexual intercourse or put anything in your vagina until your symptoms go away completely.  Get plenty of rest until your pain improves.  Drink clear fluids if you feel nauseous. Avoid solid food as long as you are uncomfortable or nauseous.  Only take over-the-counter or prescription medicine as directed by your health care provider.  Keep all follow-up appointments with your health care provider. SEEK IMMEDIATE MEDICAL CARE IF:  You are bleeding, leaking fluid, or passing tissue from the vagina.  You have increasing pain or cramping.  You have persistent vomiting.  You have painful or bloody urination.  You have a fever.  You notice a decrease in your baby's movements.  You have extreme weakness or feel faint.  You have shortness of breath, with or without abdominal pain.  You develop a severe headache with abdominal pain.  You have abnormal vaginal discharge with abdominal pain.  You have persistent diarrhea.  You have abdominal pain that continues  even after rest, or gets worse. MAKE SURE YOU:   Understand these instructions.  Will watch your condition.  Will get help right away if you are not doing well or get worse.   This information is not intended to replace advice given to you by your health care provider. Make sure you discuss any questions you have with your health care provider.   Document Released: 03/04/2005 Document Revised: 12/23/2012 Document Reviewed: 10/01/2012 Elsevier Interactive Patient Education [employer] Elsevier Inc.

## 2015-08-23 LAB — GC/CHLAMYDIA PROBE AMP (~~LOC~~) NOT AT ARMC
Chlamydia: NEGATIVE
NEISSERIA GONORRHEA: NEGATIVE

## 2015-09-06 ENCOUNTER — Encounter: Payer: Medicaid Other | Admitting: Certified Nurse Midwife

## 2015-09-06 ENCOUNTER — Ambulatory Visit (INDEPENDENT_AMBULATORY_CARE_PROVIDER_SITE_OTHER): Payer: Medicaid Other | Admitting: Certified Nurse Midwife

## 2015-09-06 ENCOUNTER — Encounter: Payer: Self-pay | Admitting: Certified Nurse Midwife

## 2015-09-06 VITALS — BP 101/63 | HR 87 | Temp 99.4°F | Wt 280.0 lb

## 2015-09-06 DIAGNOSIS — O0932 Supervision of pregnancy with insufficient antenatal care, second trimester: Secondary | ICD-10-CM | POA: Diagnosis not present

## 2015-09-06 DIAGNOSIS — O99212 Obesity complicating pregnancy, second trimester: Secondary | ICD-10-CM | POA: Diagnosis not present

## 2015-09-06 DIAGNOSIS — O0942 Supervision of pregnancy with grand multiparity, second trimester: Secondary | ICD-10-CM | POA: Diagnosis not present

## 2015-09-06 DIAGNOSIS — O09529 Supervision of elderly multigravida, unspecified trimester: Secondary | ICD-10-CM | POA: Insufficient documentation

## 2015-09-06 DIAGNOSIS — O09522 Supervision of elderly multigravida, second trimester: Secondary | ICD-10-CM

## 2015-09-06 DIAGNOSIS — Z9851 Tubal ligation status: Secondary | ICD-10-CM | POA: Insufficient documentation

## 2015-09-06 LAB — POCT URINALYSIS DIPSTICK
BILIRUBIN UA: NEGATIVE
GLUCOSE UA: NEGATIVE
KETONES UA: NEGATIVE
Nitrite, UA: NEGATIVE
PH UA: 6.5
Spec Grav, UA: 1.015
Urobilinogen, UA: NEGATIVE

## 2015-09-06 MED ORDER — ONDANSETRON HCL 4 MG PO TABS: 4.0000 mg | ORAL_TABLET | Freq: Every day | ORAL | Status: DC | PRN

## 2015-09-06 MED ORDER — PRENATE PIXIE 10-0.6-0.4-200 MG PO CAPS: 1.0000 | ORAL_CAPSULE | Freq: Every day | ORAL | Status: DC

## 2015-09-06 NOTE — Progress Notes (Signed)
Subjective:    Dana Cole is being seen today for her first obstetrical visit.  This is not a planned pregnancy. She is at [redacted]w[redacted]d gestation. Her obstetrical history is significant for advanced maternal age and obesity, had a tubal in 2009, FOB is 35 years of age. Relationship with FOB: spouse, living together. Patient does not intend to breast feed. Pregnancy history fully reviewed.  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  Has had multiple surgeries on her hips.  Works at [employer], has been their for over 4 years.  FMLA discussed.     The information documented in the HPI was reviewed and verified.  Menstrual History: OB History    Gravida Para Term Preterm AB TAB SAB Ectopic Multiple Living   Obstetric Comments   Child #3 was 5 weeks preterm.       Menarche age: 48-65 years of age.   Patient's last menstrual period was 05/22/2015 (lmp unknown).    Past Medical History  Diagnosis Date  . Medical history non-contributory   . Leukocytosis 08/07/2015    Past Surgical History  Procedure Laterality Date  . Joint replacement    . Hip surgery    . Tubal ligation       (Not in a hospital admission) Allergies  Allergen Reactions  . Shellfish Allergy Itching and Swelling    Social History  Substance Use Topics  . Smoking status: Former Smoker    Types: Cigarettes    Quit date: 06/07/2015  . Smokeless tobacco: Never Used  . Alcohol Use: Yes     Comment: occassional     Family History  Problem Relation Age of Onset  . Hypertension Other   . Diabetes Other      Review of Systems Constitutional: negative for weight loss Gastrointestinal: negative for vomiting, + nausea Genitourinary:negative for genital lesions and vaginal discharge and dysuria Musculoskeletal:negative for back pain Behavioral/Psych: negative for abusive relationship, depression, illegal drug usage and tobacco use    Objective:    BP 101/63 mmHg  Pulse 87  Temp(Src) 99.4 F  (37.4 C)  Wt 280 lb (127.007 kg)  LMP 05/22/2015 (LMP Unknown) General Appearance:    Alert, cooperative, no distress, appears stated age  Head:    Normocephalic, without obvious abnormality, atraumatic  Eyes:    PERRL, conjunctiva/corneas clear, EOM's intact, fundi    benign, both eyes  Ears:    Normal TM's and external ear canals, both ears  Nose:   Nares normal, septum midline, mucosa normal, no drainage    or sinus tenderness  Throat:   Lips, mucosa, and tongue normal; teeth and gums normal  Neck:   Supple, symmetrical, trachea midline, no adenopathy;    thyroid:  no enlargement/tenderness/nodules; no carotid   bruit or JVD  Back:     Symmetric, no curvature, ROM normal, no CVA tenderness  Lungs:     Clear to auscultation bilaterally, respirations unlabored  Chest Wall:    No tenderness or deformity   Heart:    Regular rate and rhythm, S1 and S2 normal, no murmur, rub   or gallop  Breast Exam:    No tenderness, masses, or nipple abnormality  Abdomen:     Soft, non-tender, bowel sounds active all four quadrants,    no masses, no organomegaly  Genitalia:    Normal female without lesion, discharge or tenderness  Extremities:   Extremities normal,  atraumatic, no cyanosis or edema  Pulses:   2+ and symmetric all extremities  Skin:   Skin color, texture, turgor normal, no rashes or lesions  Lymph nodes:   Cervical, supraclavicular, and axillary nodes normal  Neurologic:   CNII-XII intact, normal strength, sensation and reflexes    throughout                                Cervix:  Long, thick, closed and posterior.  FH: <U, above symphysis pubis.  FHR: 155 by doppler.       Lab Review Urine pregnancy test Labs reviewed yes Radiologic studies reviewed yes Assessment:    Pregnancy at 3669w2d weeks   N&V in pregnancy  H/O multiple hip surgeries  AMA  Late to prenatal care  H/O BTL  Plan:      Prenatal vitamins.  Counseling provided regarding continued use of seat belts,  cessation of alcohol consumption, smoking or use of illicit drugs; infection precautions i.e., influenza/TDAP immunizations, toxoplasmosis,CMV, parvovirus, listeria and varicella; workplace safety, exercise during pregnancy; routine dental care, safe medications, sexual activity, hot tubs, saunas, pools, travel, caffeine use, fish and methlymercury, potential toxins, hair treatments, varicose veins Weight gain recommendations per IOM guidelines reviewed: underweight/BMI< 18.5--> gain 28 - 40 lbs; normal weight/BMI 18.5 - 24.9--> gain 25 - 35 lbs; overweight/BMI 25 - 29.9--> gain 15 - 25 lbs; obese/BMI >30->gain  11 - 20 lbs Problem list reviewed and updated. FIRST/CF mutation testing/NIPT/QUAD SCREEN/fragile X/Ashkenazi Jewish population testing/Spinal muscular atrophy discussed: ordered. Role of ultrasound in pregnancy discussed; fetal survey: ordered. Amniocentesis discussed: not indicated. VBAC calculator score: VBAC consent form provided Meds ordered this encounter  Medications  . ondansetron (ZOFRAN) 4 MG tablet    Sig: Take 1 tablet (4 mg total) by mouth daily as needed.    Dispense:  30 tablet    Refill:  1  . Prenat-FeAsp-Meth-FA-DHA w/o A (PRENATE PIXIE) 10-0.6-0.4-200 MG CAPS    Sig: Take 1 tablet by mouth daily.    Dispense:  30 capsule    Refill:  12    Please process coupon: Rx BIN: [id number], RxPCN: [id number], RxGRP: [id number]: [id number], Rx: [id number]: [id number]  SUF: 01   Orders Placed This Encounter  Procedures  . Culture, OB Urine  . US MFM OB COMP + 14 WK    Standing Status: Future     Number of Occurrences:      Standing Expiration Date: 11/05/2016    Order Specific Question:  Reason for Exam (SYMPTOM  OR DIAGNOSIS REQUIRED)    Answer:  fetal anatomy scan, obesity, late prenatal care    Order Specific Question:  Preferred imaging location?    Answer:  [facility]  . TSH  . HIV antibody  . Hemoglobinopathy evaluation  . Varicella zoster antibody, IgG  . Prenatal Profile I  .  MaterniT21 PLUS Core+SCA    Order Specific Question:  Is the patient insulin dependent?    Answer:  No    Order Specific Question:  Please enter gestational age. This should be expressed as weeks AND days, i.e. 16w 6d. Enter weeks here. Enter days in next question.    Answer:  2015    Order Specific Question:  Please enter gestational age. This should be expressed as weeks AND days, i.e. 16w 6d. Enter days here. Enter weeks in previous question.    Answer:  2    Order Specific Question:  How was gestational age  calculated?    Answer:  LMP    Order Specific Question:  Please give the date of LMP OR Ultrasound OR Estimated date of delivery.    Answer:  02/26/2016    Order Specific Question:  Number of Fetuses (Type of Pregnancy):    Answer:  1    Order Specific Question:  Indications for performing the test? (please choose all that apply):    Answer:  Routine screening    Order Specific Question:  Other Indications? (Y=Yes, N=No)    Answer:  Y    Order Specific Question:  Please specify other indications, if any:    Answer:  AMA    Order Specific Question:  If this is a repeat specimen, please indicate the reason:    Answer:  Not indicated    Order Specific Question:  Please specify the patient's race: (C=White/Caucasion, B=Black, I=Native American, A=Asian, H=Hispanic, O=Other, U=Unknown)    Answer:  B    Order Specific Question:  Donor Egg - indicate if the egg was obtained from in vitro fertilization.    Answer:  N    Order Specific Question:  Age of Egg Donor.    Answer:  83    Order Specific Question:  Prior Down Syndrome/ONTD screening during current pregnancy.    Answer:  N    Order Specific Question:  Prior First Trimester Testing    Answer:  N    Order Specific Question:  Prior Second Trimester Testing    Answer:  N    Order Specific Question:  Family History of Neural Tube Defects    Answer:  N    Order Specific Question:  Prior Pregnancy with Down Syndrome    Answer:  N     Order Specific Question:  Please give the patient's weight (in pounds)    Answer:  270  . POCT urinalysis dipstick    Follow up in 4 weeks. 50% of 30 min visit spent on counseling and coordination of care.

## 2015-09-06 NOTE — Addendum Note (Signed)
Addended by: Marya LandryFOSTER, Georgeanna Radziewicz D on: 09/06/2015 04:20 PM   Modules accepted: Orders

## 2015-09-08 LAB — CULTURE, OB URINE

## 2015-09-08 LAB — URINE CULTURE, OB REFLEX

## 2015-09-09 LAB — NUSWAB VG+, CANDIDA 6SP
CANDIDA ALBICANS, NAA: POSITIVE — AB
CANDIDA GLABRATA, NAA: NEGATIVE
CANDIDA LUSITANIAE, NAA: NEGATIVE
CANDIDA PARAPSILOSIS, NAA: NEGATIVE
CHLAMYDIA TRACHOMATIS, NAA: NEGATIVE
Candida krusei, NAA: NEGATIVE
Candida tropicalis, NAA: NEGATIVE
NEISSERIA GONORRHOEAE, NAA: NEGATIVE
Trich vag by NAA: NEGATIVE

## 2015-09-12 ENCOUNTER — Other Ambulatory Visit: Payer: Self-pay | Admitting: Certified Nurse Midwife

## 2015-09-12 DIAGNOSIS — B3731 Acute candidiasis of vulva and vagina: Secondary | ICD-10-CM

## 2015-09-12 DIAGNOSIS — B373 Candidiasis of vulva and vagina: Secondary | ICD-10-CM

## 2015-09-12 LAB — PRENATAL PROFILE I(LABCORP)
ANTIBODY SCREEN: NEGATIVE
BASOS: 0 %
Basophils Absolute: 0 10*3/uL (ref 0.0–0.2)
EOS (ABSOLUTE): 0.1 10*3/uL (ref 0.0–0.4)
EOS: 1 %
HEMATOCRIT: 35.1 % (ref 34.0–46.6)
HEP B S AG: NEGATIVE
Hemoglobin: 11.8 g/dL (ref 11.1–15.9)
IMMATURE GRANS (ABS): 0.1 10*3/uL (ref 0.0–0.1)
Immature Granulocytes: 1 %
Lymphocytes Absolute: 2.6 10*3/uL (ref 0.7–3.1)
Lymphs: 24 %
MCH: 29.2 pg (ref 26.6–33.0)
MCHC: 33.6 g/dL (ref 31.5–35.7)
MCV: 87 fL (ref 79–97)
MONOCYTES: 8 %
Monocytes Absolute: 0.8 10*3/uL (ref 0.1–0.9)
NEUTROS ABS: 7.1 10*3/uL — AB (ref 1.4–7.0)
Neutrophils: 66 %
Platelets: 250 10*3/uL (ref 150–379)
RBC: 4.04 x10E6/uL (ref 3.77–5.28)
RDW: 14.5 % (ref 12.3–15.4)
RH TYPE: POSITIVE
RPR: NONREACTIVE
RUBELLA: 1.01 {index} (ref >0.99)
WBC: 10.6 10*3/uL (ref 3.4–10.8)

## 2015-09-12 LAB — MATERNIT21 PLUS CORE+SCA
CHROMOSOME 18: NEGATIVE
Chromosome 13: NEGATIVE
Chromosome 21: NEGATIVE
PDF: 0
Y Chromosome: DETECTED

## 2015-09-12 LAB — PAP IG AND HPV HIGH-RISK
HPV, high-risk: NEGATIVE
PAP SMEAR COMMENT: 0

## 2015-09-12 LAB — VARICELLA ZOSTER ANTIBODY, IGG: Varicella zoster IgG: 2777 index (ref >165)

## 2015-09-12 LAB — HEMOGLOBINOPATHY EVALUATION
HGB C: 0 %
HGB S: 0 %
Hemoglobin A2 Quantitation: 2.2 % (ref 0.7–3.1)
Hemoglobin F Quantitation: 0 % (ref 0.0–2.0)
Hgb A: 97.8 % (ref 94.0–98.0)

## 2015-09-12 LAB — TSH: TSH: 0.812 u[IU]/mL (ref 0.450–4.500)

## 2015-09-12 LAB — HIV ANTIBODY (ROUTINE TESTING W REFLEX): HIV SCREEN 4TH GENERATION: NONREACTIVE

## 2015-09-12 MED ORDER — FLUCONAZOLE 100 MG PO TABS: 100.0000 mg | ORAL_TABLET | Freq: Once | ORAL | Status: DC

## 2015-09-12 MED ORDER — TERCONAZOLE 0.8 % VA CREA: 1.0000 | TOPICAL_CREAM | Freq: Every day | VAGINAL | Status: DC

## 2015-09-13 ENCOUNTER — Telehealth (HOSPITAL_COMMUNITY): Payer: Self-pay | Admitting: *Deleted

## 2015-10-03 ENCOUNTER — Other Ambulatory Visit: Payer: Self-pay | Admitting: Certified Nurse Midwife

## 2015-10-03 ENCOUNTER — Encounter: Payer: Self-pay | Admitting: *Deleted

## 2015-10-03 ENCOUNTER — Encounter (HOSPITAL_COMMUNITY): Payer: Self-pay

## 2015-10-03 ENCOUNTER — Ambulatory Visit (HOSPITAL_COMMUNITY)
Admission: RE | Admit: 2015-10-03 | Discharge: 2015-10-03 | Disposition: A | Discharge status: Home / Self Care | Payer: Medicaid Other | Source: Ambulatory Visit | Attending: Certified Nurse Midwife | Admitting: Certified Nurse Midwife

## 2015-10-03 ENCOUNTER — Ambulatory Visit (INDEPENDENT_AMBULATORY_CARE_PROVIDER_SITE_OTHER): Payer: Medicaid Other | Admitting: Certified Nurse Midwife

## 2015-10-03 DIAGNOSIS — O0932 Supervision of pregnancy with insufficient antenatal care, second trimester: Secondary | ICD-10-CM | POA: Diagnosis not present

## 2015-10-03 DIAGNOSIS — O99212 Obesity complicating pregnancy, second trimester: Secondary | ICD-10-CM

## 2015-10-03 DIAGNOSIS — O09522 Supervision of elderly multigravida, second trimester: Secondary | ICD-10-CM | POA: Diagnosis present

## 2015-10-03 DIAGNOSIS — Z3A19 19 weeks gestation of pregnancy: Secondary | ICD-10-CM | POA: Insufficient documentation

## 2015-10-03 DIAGNOSIS — B373 Candidiasis of vulva and vagina: Secondary | ICD-10-CM

## 2015-10-03 DIAGNOSIS — Z36 Encounter for antenatal screening of mother: Secondary | ICD-10-CM | POA: Insufficient documentation

## 2015-10-03 DIAGNOSIS — O0942 Supervision of pregnancy with grand multiparity, second trimester: Secondary | ICD-10-CM

## 2015-10-03 DIAGNOSIS — B3731 Acute candidiasis of vulva and vagina: Secondary | ICD-10-CM

## 2015-10-03 DIAGNOSIS — Z3482 Encounter for supervision of other normal pregnancy, second trimester: Secondary | ICD-10-CM

## 2015-10-03 DIAGNOSIS — Z1389 Encounter for screening for other disorder: Secondary | ICD-10-CM

## 2015-10-03 LAB — POCT URINALYSIS DIPSTICK
BILIRUBIN UA: NEGATIVE
Blood, UA: 50
GLUCOSE UA: NEGATIVE
Ketones, UA: NEGATIVE
LEUKOCYTES UA: NEGATIVE
NITRITE UA: NEGATIVE
Spec Grav, UA: 1.02
UROBILINOGEN UA: NEGATIVE
pH, UA: 5

## 2015-10-03 MED ORDER — FLUCONAZOLE 100 MG PO TABS: 100.0000 mg | ORAL_TABLET | Freq: Once | ORAL | Status: DC

## 2015-10-03 MED ORDER — TERCONAZOLE 0.8 % VA CREA: 1.0000 | TOPICAL_CREAM | Freq: Every day | VAGINAL | Status: DC

## 2015-10-03 NOTE — Progress Notes (Signed)
Subjective:    Dana Cole is a 35 y.o. female being seen today for her obstetrical visit. She is at 5285w5d gestation. Patient reports: backache, fatigue, no bleeding, no contractions, no cramping, no leaking, vaginal irritation and vaginal burning from presumed continued candida. . Fetal movement: normal.  Requesting work note, for being able to sit down and take frequent breaks.  Problem List Items Addressed This Visit    None    Visit Diagnoses    Encounter for supervision of other normal pregnancy in second trimester    -  Primary    Relevant Orders    POCT urinalysis dipstick (Completed)    Yeast vaginitis        Relevant Medications    terconazole (TERAZOL 3) 0.8 % vaginal cream    fluconazole (DIFLUCAN) 100 MG tablet      Patient Active Problem List   Diagnosis Date Noted  . AMA (advanced maternal age) multigravida 35+ 09/06/2015  . Late prenatal care affecting pregnancy in second trimester, antepartum 09/06/2015  . Surgical history of tubal ligation 09/06/2015  . Leukocytosis 08/07/2015   Objective:    LMP 05/22/2015 (LMP Unknown) FHT: 140 BPM  Uterine Size: undetermined d/t body habitus     Assessment:    Pregnancy @ 6285w5d   Vaginal candida. Normal complaints of pregnancy. Plan:    OBGCT: discussed. Signs and symptoms of preterm labor: discussed. Diflucan & Terazol RX for Candida. Labs, problem list reviewed and updated 2 hr GTT planned Follow up in 4 weeks.

## 2015-10-03 NOTE — Progress Notes (Signed)
I agree with note by NP Student Andrew Brake.  Was present for exam.  R.Denney CNM 

## 2015-10-04 NOTE — Telephone Encounter (Signed)
See phone note for last patient call.

## 2015-10-09 ENCOUNTER — Telehealth: Payer: Self-pay | Admitting: *Deleted

## 2015-10-09 NOTE — Telephone Encounter (Signed)
Patient is calling- she did the yeast treatments, but she is still experiencing burning in the vagina. She states the treatment didn't make a big difference. She also stated she had been treated for BV previous to that treatment. What do you want her to do now?

## 2015-10-10 ENCOUNTER — Other Ambulatory Visit: Payer: Self-pay | Admitting: Certified Nurse Midwife

## 2015-10-10 NOTE — Telephone Encounter (Signed)
We need to swab her and evaluate her with her next ROB if that is sooner rather than later.  Thank you. R.Graclyn Lawther CNM

## 2015-10-11 NOTE — Telephone Encounter (Signed)
Patient needs appointment - please call to schedule her.

## 2015-10-12 ENCOUNTER — Telehealth: Payer: Self-pay | Admitting: *Deleted

## 2015-10-25 ENCOUNTER — Encounter: Payer: Medicaid Other | Admitting: Certified Nurse Midwife

## 2015-10-25 NOTE — Telephone Encounter (Signed)
Plan of care discussed with patient and medication sent to pharmacy.

## 2015-10-31 ENCOUNTER — Ambulatory Visit (INDEPENDENT_AMBULATORY_CARE_PROVIDER_SITE_OTHER): Payer: Medicaid Other | Admitting: Certified Nurse Midwife

## 2015-10-31 ENCOUNTER — Encounter: Payer: Medicaid Other | Admitting: Certified Nurse Midwife

## 2015-10-31 VITALS — BP 123/77 | HR 71 | Wt 279.0 lb

## 2015-10-31 DIAGNOSIS — Z3482 Encounter for supervision of other normal pregnancy, second trimester: Secondary | ICD-10-CM

## 2015-10-31 DIAGNOSIS — O0992 Supervision of high risk pregnancy, unspecified, second trimester: Secondary | ICD-10-CM

## 2015-10-31 NOTE — Addendum Note (Signed)
Addended by: Marya LandryFOSTER, Keelen Quevedo D on: 10/31/2015 11:39 AM   Modules accepted: Orders

## 2015-10-31 NOTE — Progress Notes (Signed)
Subjective:    Dana Cole is a 35 y.o. female being seen today for her obstetrical visit. She is at 799w5d gestation. Patient reports: no bleeding, no contractions, no cramping, no leaking and vaginal irritation . Fetal movement: normal.  Problem List Items Addressed This Visit      Other   Supervision of high risk pregnancy in second trimester     [facility]  [facility] [facility]  Dating  LMP Blood type: AB/Positive/-- (06/21 1429)   Genetic Screen 1 Screen:    AFP:     Quad:     NIPS: Antibody:Negative (06/21 1429)  Anatomic US  Rubella: 1.01 (06/21 1429)  GTT Early:               Third trimester:  RPR: Non Reactive (06/21 1429)   Flu vaccine  HBsAg: Negative (06/21 1429)   TDaP vaccine                                               Rhogam: HIV: Non Reactive (06/21 1429)   Baby Food                           Breast                    GBS: (For PCN allergy, check sensitivities)  Contraception  Failed BTL: desires salpinectomy Pap:  Circumcision  [facility]   [facility]   Support Person           Relevant Orders   US MFM OB FOLLOW UP    Other Visit Diagnoses    Encounter for supervision of other normal pregnancy in second trimester    -  Primary   Relevant Orders   POCT urinalysis dipstick     Patient Active Problem List   Diagnosis Date Noted  . Supervision of high risk pregnancy in second trimester 10/31/2015  . AMA (advanced maternal age) multigravida 35+ 09/06/2015  . Late prenatal care affecting pregnancy in second trimester, antepartum 09/06/2015  . Surgical history of tubal ligation 09/06/2015  . Leukocytosis 08/07/2015   Objective:    BP 123/77   Pulse 71   Wt 279 lb (126.6 kg)   LMP 05/22/2015 (LMP Unknown)   BMI 41.20 kg/m  FHT: 150 BPM  Uterine Size: 24 cm and size equals dates     Assessment:    Pregnancy @ 229w5d    Morbid obesity   Failed BTL: currently pregnant  Plan:    OBGCT: discussed and ordered  for next visit. Signs and symptoms of preterm labor: discussed.  Labs, problem list reviewed and updated 2 hr GTT planned Follow up in 4 weeks with 2 hour OGTT.

## 2015-10-31 NOTE — Assessment & Plan Note (Signed)
  [facility]  [facility] [facility]  Dating  LMP Blood type: AB/Positive/-- (06/21 1429)   Genetic Screen 1 Screen:    AFP:     Quad:     NIPS: Antibody:Negative (06/21 1429)  Anatomic US  Rubella: 1.01 (06/21 1429)  GTT Early:               Third trimester:  RPR: Non Reactive (06/21 1429)   Flu vaccine  HBsAg: Negative (06/21 1429)   TDaP vaccine                                               Rhogam: HIV: Non Reactive (06/21 1429)   Baby Food                           Breast                    GBS: (For PCN allergy, check sensitivities)  Contraception  Failed BTL: desires salpinectomy Pap:  Circumcision  [facility]   [facility]ian  [facility]   Support Person

## 2015-11-03 LAB — NUSWAB VG+, CANDIDA 6SP
CANDIDA KRUSEI, NAA: NEGATIVE
CANDIDA PARAPSILOSIS, NAA: NEGATIVE
CANDIDA TROPICALIS, NAA: NEGATIVE
Candida albicans, NAA: NEGATIVE
Candida glabrata, NAA: NEGATIVE
Candida lusitaniae, NAA: NEGATIVE
Chlamydia trachomatis, NAA: NEGATIVE
Neisseria gonorrhoeae, NAA: NEGATIVE
TRICH VAG BY NAA: NEGATIVE

## 2015-11-06 ENCOUNTER — Encounter: Payer: Self-pay | Admitting: *Deleted

## 2015-11-23 ENCOUNTER — Encounter (HOSPITAL_COMMUNITY): Payer: Self-pay | Admitting: *Deleted

## 2015-11-23 ENCOUNTER — Inpatient Hospital Stay (HOSPITAL_COMMUNITY)
Admission: AD | Admit: 2015-11-23 | Discharge: 2015-11-23 | Disposition: A | Discharge status: Home / Self Care | Payer: 59 | Source: Ambulatory Visit | Attending: Obstetrics and Gynecology | Admitting: Obstetrics and Gynecology

## 2015-11-23 DIAGNOSIS — Z3A27 27 weeks gestation of pregnancy: Secondary | ICD-10-CM | POA: Diagnosis not present

## 2015-11-23 DIAGNOSIS — Z87891 Personal history of nicotine dependence: Secondary | ICD-10-CM | POA: Diagnosis not present

## 2015-11-23 HISTORY — DX: Chlamydial infection, unspecified: A74.9

## 2015-11-23 HISTORY — DX: Trichomoniasis, unspecified: A59.9

## 2015-11-23 LAB — URINALYSIS, ROUTINE W REFLEX MICROSCOPIC
Bilirubin Urine: NEGATIVE
Glucose, UA: NEGATIVE mg/dL
HGB URINE DIPSTICK: NEGATIVE
Ketones, ur: NEGATIVE mg/dL
LEUKOCYTES UA: NEGATIVE
Nitrite: NEGATIVE
Protein, ur: NEGATIVE mg/dL
SPECIFIC GRAVITY, URINE: 1.02 (ref 1.005–1.030)
pH: 6 (ref 5.0–8.0)

## 2015-11-23 NOTE — MAU Note (Signed)
Pt reports she has been ctx on and off since yesterday q 10-12 min now. Good fetal movement reported and denies vag bleeding or leaking.

## 2015-11-23 NOTE — MAU Provider Note (Signed)
Chief Complaint:  Contractions   First Provider Initiated Contact with Patient 11/23/15 1316      HPI: Dana Cole is a 35 y.o. [id number] at 4033w0d who presents to maternity admissions reporting contractions starting yesterday at 5am. She does not know how far apart they are, reports maybe every 10 to 15 minutes. No significant discomfort with them. Denies any other concerns, leakage of fluid, vaginal bleeding, or vaginal discharge. Good fetal movement.   Pregnancy Course: [facility]MA, [facility]  Past Medical History: Past Medical History:  Diagnosis Date  . Chlamydia   . Leukocytosis 08/07/2015  . Medical history non-contributory   . Trichomonas infection     Past obstetric history: OB History  Gravida Para Term Preterm AB Living  6 4 3 1 1 4   SAB TAB Ectopic Multiple Live Births  1       4    # Outcome Date GA Lbr Len/2nd Weight Sex Delivery Anes PTL Lv  6 Current           5 Term 09/07/07   6 lb 5 oz (2.863 kg) M Vag-Spont None N LIV  4 Term 08/18/02   5 lb 5 oz (2.41 kg) F Vag-Spont EPI N LIV  3 Preterm 09/15/99   6 lb 8 oz (2.948 kg) M Vag-Spont EPI N LIV  2 Term 01/14/98   7 lb 14 oz (3.572 kg) F Vag-Spont Local, Gen N LIV  1 SAB             Obstetric Comments  Child #3 was 5 weeks preterm.     Past Surgical History: Past Surgical History:  Procedure Laterality Date  . HIP SURGERY    . JOINT REPLACEMENT    . TUBAL LIGATION       Family History: Family History  Problem Relation Age of Onset  . Hypertension Other   . Diabetes Other     Social History: Social History  Substance Use Topics  . Smoking status: Former Smoker    Types: Cigarettes    Quit date: 06/07/2015  . Smokeless tobacco: Never Used  . Alcohol use Yes     Comment: occassional     Allergies:  Allergies  Allergen Reactions  . Shellfish Allergy Itching and Swelling    Meds:  Prescriptions Prior to Admission  Medication Sig Dispense Refill Last Dose  . acetaminophen (TYLENOL) 325 MG tablet  Take 650 mg by mouth every 6 (six) hours as needed for moderate pain.   Taking  . fluconazole (DIFLUCAN) 100 MG tablet Take 1 tablet (100 mg total) by mouth once. Repeat dose in 48-72 hour. 3 tablet 0   . fluconazole (DIFLUCAN) 150 MG tablet Take 150 mg by mouth every other day.  0   . metroNIDAZOLE (FLAGYL) 500 MG tablet Take 500 mg by mouth 2 (two) times daily. for 7 days  0   . ondansetron (ZOFRAN) 4 MG tablet Take 1 tablet (4 mg total) by mouth daily as needed. 30 tablet 1 Taking  . Prenat-FeAsp-Meth-FA-DHA w/o A (PRENATE PIXIE) 10-0.6-0.4-200 MG CAPS Take 1 tablet by mouth daily. 30 capsule 12 Taking  . Prenatal Vit-Fe Phos-FA-Omega (VITAFOL GUMMIES) 3.33-0.333-34.8 MG CHEW Chew 3 each by mouth daily. Reported on 10/03/2015  8 Not Taking  . ranitidine (ZANTAC) 150 MG capsule Take 1 capsule (150 mg total) by mouth daily. 30 capsule 2 Taking  . ranitidine (ZANTAC) 150 MG tablet Take 150 mg by mouth daily.  2   . terconazole (TERAZOL  3) 0.8 % vaginal cream Place 1 applicator vaginally at bedtime. 20 g 0     I have reviewed patient's Past Medical Hx, Surgical Hx, Family Hx, Social Hx, medications and allergies.   ROS:  A comprehensive ROS was negative except per HPI.    Physical Exam  Patient Vitals for the past 24 hrs:  BP Temp Temp src Pulse Resp Height Weight  11/23/15 1206 110/83 98.2 F (36.8 C) Oral 84 18 5\' 9"  (1.753 m) 231 lb 1.9 oz (104.8 kg)   Constitutional: Well-developed, well-nourished female in no acute distress.  Cardiovascular: normal rate Respiratory: normal effort GI: Abd soft, non-tender, gravid appropriate for gestational age. Pos BS x 4 MS: Extremities nontender, no edema, normal ROM Neurologic: Alert and oriented x 4.  SVE: closed, thick, high FHT:  Baseline 145 , moderate variability, accelerations present, rare variable Contractions: none   Labs: Results for orders placed or performed during the hospital encounter of 11/23/15 (from the past 24 hour(s))   Urinalysis, Routine w reflex microscopic (not at [facility])     Status: None   Collection Time: 11/23/15 12:00 PM  Result Value Ref Range   Color, Urine YELLOW YELLOW   APPearance CLEAR CLEAR   Specific Gravity, Urine 1.020 1.005 - 1.030   pH 6.0 5.0 - 8.0   Glucose, UA NEGATIVE NEGATIVE mg/dL   Hgb urine dipstick NEGATIVE NEGATIVE   Bilirubin Urine NEGATIVE NEGATIVE   Ketones, ur NEGATIVE NEGATIVE mg/dL   Protein, ur NEGATIVE NEGATIVE mg/dL   Nitrite NEGATIVE NEGATIVE   Leukocytes, UA NEGATIVE NEGATIVE    MDM: Plan of care reviewed with patient, including labs and tests ordered and medical treatment.   Assessment: 35 y.o. Z6X0960 at [redacted]w[redacted]d here for contractions. No contractions on monitor, pt comfortable. SVE reassuring.   Plan: - Discharge home in stable condition.  - Discussed PTL precautions    Frederik Pear, MD 11/23/2015 1:37 PM  The patient was seen and examined by me also Agree with note NST reactive and reassuring UCs as listed Cervical exams as listed in note   Clearly not in preterm labor.  Ready for discharge Will have her followup in clinic as scheduled Aviva Signs, CNM

## 2015-11-23 NOTE — Discharge Instructions (Signed)

## 2015-11-27 ENCOUNTER — Encounter (HOSPITAL_COMMUNITY): Payer: Self-pay

## 2015-11-28 ENCOUNTER — Other Ambulatory Visit: Payer: Medicaid Other

## 2015-11-28 ENCOUNTER — Ambulatory Visit (INDEPENDENT_AMBULATORY_CARE_PROVIDER_SITE_OTHER): Payer: Medicaid Other | Admitting: Certified Nurse Midwife

## 2015-11-28 ENCOUNTER — Other Ambulatory Visit: Payer: Self-pay | Admitting: Certified Nurse Midwife

## 2015-11-28 ENCOUNTER — Encounter (HOSPITAL_COMMUNITY): Payer: Self-pay

## 2015-11-28 ENCOUNTER — Ambulatory Visit (HOSPITAL_COMMUNITY)
Admission: RE | Admit: 2015-11-28 | Discharge: 2015-11-28 | Disposition: A | Discharge status: Home / Self Care | Payer: Medicaid Other | Source: Ambulatory Visit | Attending: Certified Nurse Midwife | Admitting: Certified Nurse Midwife

## 2015-11-28 VITALS — BP 119/80 | HR 89 | Temp 98.9°F | Wt 279.4 lb

## 2015-11-28 VITALS — BP 112/69 | HR 101 | Wt 280.2 lb

## 2015-11-28 DIAGNOSIS — Z3492 Encounter for supervision of normal pregnancy, unspecified, second trimester: Secondary | ICD-10-CM | POA: Diagnosis not present

## 2015-11-28 DIAGNOSIS — O09522 Supervision of elderly multigravida, second trimester: Secondary | ICD-10-CM | POA: Diagnosis not present

## 2015-11-28 DIAGNOSIS — O0992 Supervision of high risk pregnancy, unspecified, second trimester: Secondary | ICD-10-CM

## 2015-11-28 DIAGNOSIS — Z3A27 27 weeks gestation of pregnancy: Secondary | ICD-10-CM | POA: Insufficient documentation

## 2015-11-28 DIAGNOSIS — R319 Hematuria, unspecified: Secondary | ICD-10-CM

## 2015-11-28 DIAGNOSIS — O09529 Supervision of elderly multigravida, unspecified trimester: Secondary | ICD-10-CM

## 2015-11-28 DIAGNOSIS — Z36 Encounter for antenatal screening of mother: Secondary | ICD-10-CM | POA: Insufficient documentation

## 2015-11-28 DIAGNOSIS — O99212 Obesity complicating pregnancy, second trimester: Secondary | ICD-10-CM | POA: Insufficient documentation

## 2015-11-28 NOTE — Progress Notes (Signed)
Subjective:    Dana Cole is a 35 y.o. female being seen today for her obstetrical visit. She is at 5844w5d gestation. Patient reports: no complaints . Fetal movement: normal.  Problem List Items Addressed This Visit      Other   Supervision of high risk pregnancy in second trimester    Other Visit Diagnoses    Prenatal care, second trimester    -  Primary   Relevant Orders   Glucose Tolerance, 2 Hours w/1 Hour   CBC   HIV antibody   RPR   Hematuria       Relevant Orders   Culture, OB Urine     Patient Active Problem List   Diagnosis Date Noted  . Supervision of high risk pregnancy in second trimester 10/31/2015  . AMA (advanced maternal age) multigravida 35+ 09/06/2015  . Late prenatal care affecting pregnancy in second trimester, antepartum 09/06/2015  . Surgical history of tubal ligation 09/06/2015  . Leukocytosis 08/07/2015   Objective:    BP 119/80   Pulse 89   Temp 98.9 F (37.2 C)   Wt 279 lb 6.4 oz (126.7 kg)   LMP 05/22/2015 (LMP Unknown)   BMI 41.26 kg/m  FHT: 146 BPM  Uterine Size: 30 cm and size greater than dates     Assessment:    Pregnancy @ 6044w5d    AMA   Morbid maternal obesity  Plan:    Has f/u US today @ [facility]  OBGCT: discussed and ordered for next visit. Signs and symptoms of preterm labor: discussed and handout given. VBAC: discussed and planned and desires bilateral salpinectomy d/t failed BTL. BTS consent on file: 11/06/15 Labs, problem list reviewed and updated 2 hr GTT today Follow up in 2 weeks.

## 2015-11-28 NOTE — Progress Notes (Signed)
Patient is doing well today. UA- Ket +2 RBC 50

## 2015-11-28 NOTE — Patient Instructions (Addendum)
Before [facility] Before your baby arrives it is important to:  Have all of the supplies that you will need to care for your baby.  Know where to go if there is an emergency.  Discuss the baby's arrival with other family members. WHAT SUPPLIES WILL I NEED? It is recommended that you have the following supplies: Large Items  Crib.  Crib mattress.  Rear-facing infant car seat. If possible, have a trained professional check to make sure that it is installed correctly. Feeding  6-8 bottles that are 4-5 oz in size.  6-8 nipples.  Bottle brush.  Sterilizer, or a large pan or kettle with a lid.  A way to boil and cool water.  If you will be breastfeeding:  Breast pump.  Nipple cream.  Nursing bra.  Breast pads.  Breast shields.  If you will be formula feeding:  Formula.  Measuring cups.  Measuring spoons. Bathing  Mild baby soap and baby shampoo.  Petroleum jelly.  Soft cloth towel and washcloth.  Hooded towel.  Cotton balls.  Bath basin. Other Supplies  Rectal thermometer.  Bulb syringe.  Baby wipes or washcloths for diaper changes.  Diaper bag.  Changing pad.  Clothing, including one-piece outfits and pajamas.  Baby nail clippers.  Receiving blankets.  Mattress pad and sheets for the crib.  Night-light for the baby's room.  Baby monitor.  2 or 3 pacifiers.  Either 24-36 cloth diapers and waterproof diaper covers or a box of disposable diapers. You may need to use as many as 10-12 diapers per day. HOW DO I PREPARE FOR AN EMERGENCY? Prepare for an emergency by:  Knowing how to get to the nearest hospital.  Listing the phone numbers of your baby's health care providers near your home phone and in your cell phone. HOW DO I PREPARE MY FAMILY?  Decide how to handle visitors.  If you have other children:  Talk with them about the baby coming home. Ask them how they feel about it.  Read a book together about being a new big  brother or sister.  Find ways to let them help you prepare for the new baby.  Have someone ready to care for them while you are in the hospital.   This information is not intended to replace advice given to you by your health care provider. Make sure you discuss any questions you have with your health care provider.   Document Released: 02/15/2008 Document Revised: 07/19/2014 Document Reviewed: 02/09/2014 Elsevier Interactive Patient Education 2016 Elsevier Inc. Glucose Tolerance Test During Pregnancy The glucose tolerance test (GTT) is a blood test used to determine if you have developed a type of diabetes during pregnancy (gestational diabetes). This is when your body does not properly process sugar (glucose) in the food you eat, resulting in high blood glucose levels. Typically, a GTT is done after you have had a 1-hour glucose test with results that indicate you possibly have gestational diabetes. It may also be done if:  You have a history of giving birth to very large babies or have experienced repeated fetal loss (stillbirth).   You have signs and symptoms of diabetes, such as:   Changes in your vision.   Tingling or numbness in your hands or feet.   Changes in hunger, thirst, and urination not otherwise explained by your pregnancy.  The GTT lasts about 3 hours. You will be given a sugar-water solution to drink at the beginning of the test. You will have blood drawn  before you drink the solution and then again 1, 2, and 3 hours after you drink it. You will not be allowed to eat or drink anything else during the test. You must remain at the testing location to make sure that your blood is drawn on time. You should also avoid exercising during the test, because exercise can alter test results. PREPARATION FOR TEST  Eat normally for 3 days prior to the GTT test, including having plenty of carbohydrate-rich foods. Do not eat or drink anything except water during the final 12 hours  before the test. In addition, your health care provider may ask you to stop taking certain medicines before the test. RESULTS  It is your responsibility to obtain your test results. Ask the lab or department performing the test when and how you will get your results. Contact your health care provider to discuss any questions you have about your results.  Range of Normal Values Ranges for normal values may vary among different labs and hospitals. You should always check with your health care provider after having lab work or other tests done to discuss whether your values are considered within normal limits. Normal levels of blood glucose are as follows:  Fasting: less than 105 mg/dL.   1 hour after drinking the solution: less than 190 mg/dL.   2 hours after drinking the solution: less than 165 mg/dL.   3 hours after drinking the solution: less than 145 mg/dL.  Some substances can interfere with GTT results. These may include:  Blood pressure and heart failure medicines, including beta blockers, furosemide, and thiazides.   Anti-inflammatory medicines, including aspirin.   Nicotine.   Some psychiatric medicines.  Meaning of Results Outside Normal Value Ranges GTT test results that are above normal values may indicate a number of health problems, such as:   Gestational diabetes.   Acute stress response.   Cushing syndrome.   Tumors such as pheochromocytoma or glucagonoma.   Long-term kidney problems.   Pancreatitis.   Hyperthyroidism.   Current infection.  Discuss your test results with your health care provider. He or she will use the results to make a diagnosis and determine a treatment plan that is right for you.   This information is not intended to replace advice given to you by your health care provider. Make sure you discuss any questions you have with your health care provider.   Document Released: 09/03/2011 Document Revised: 03/25/2014 Document  Reviewed: 07/09/2013 Elsevier Interactive Patient Education 2016 Elsevier Inc.  Heartburn During Pregnancy Heartburn is a burning sensation in the chest caused by stomach acid backing up into the esophagus. Heartburn is common in pregnancy because a certain hormone (progesterone) is released when a woman is pregnant. The progesterone hormone may relax the valve that separates the esophagus from the stomach. This allows acid to go up into the esophagus, causing heartburn. Heartburn may also happen in pregnancy because the enlarging uterus pushes up on the stomach, which pushes more acid into the esophagus. This is especially true in the later stages of pregnancy. Heartburn problems usually go away after giving birth. CAUSES  Heartburn is caused by stomach acid backing up into the esophagus. During pregnancy, this may result from various things, including:   The progesterone hormone.  Changing hormone levels.  The growing uterus pushing stomach acid upward.  Large meals.  Certain foods and drinks.  Exercise.  Increased acid production. SIGNS AND SYMPTOMS   Burning pain in the chest or lower throat.  Bitter taste in the mouth.  Coughing. DIAGNOSIS  Your health care provider will typically diagnose heartburn by taking a careful history of your concern. Blood tests may be done to check for a certain type of bacteria that is associated with heartburn. Sometimes, heartburn is diagnosed by prescribing a heartburn medicine to see if the symptoms improve. In some cases, a procedure called an endoscopy may be done. In this procedure, a tube with a light and a camera on the end (endoscope) is used to examine the esophagus and the stomach. TREATMENT  Treatment will vary depending on the severity of your symptoms. Your health care provider may recommend:  Over-the-counter medicines (antacids, acid reducers) for mild heartburn.  Prescription medicines to decrease stomach acid or to protect your  stomach lining.  Certain changes in your diet.  Elevating the head of your bed by putting blocks under the legs. This helps prevent stomach acid from backing up into the esophagus when you are lying down. HOME CARE INSTRUCTIONS   Only take over-the-counter or prescription medicines as directed by your health care provider.  Raise the head of your bed by putting blocks under the legs if instructed to do so by your health care provider. Sleeping with more pillows is not effective because it only changes the position of your head.  Do not exercise right after eating.  Avoid eating 2-3 hours before bed. Do not lie down right after eating.  Eat small meals throughout the day instead of three large meals.  Identify foods and beverages that make your symptoms worse and avoid them. Foods you may want to avoid include:  Peppers.  Chocolate.  High-fat foods, including fried foods.  Spicy foods.  Garlic and onions.  Citrus fruits, including oranges, grapefruit, lemons, and limes.  Food containing tomatoes or tomato products.  Mint.  Carbonated and caffeinated drinks.  Vinegar. SEEK MEDICAL CARE IF:  You have abdominal pain of any kind.  You feel burning in your upper abdomen or chest, especially after eating or lying down.  You have nausea and vomiting.  Your stomach feels upset after you eat. SEEK IMMEDIATE MEDICAL CARE IF:   You have severe chest pain that goes down your arm or into your jaw or neck.  You feel sweaty, dizzy, or light-headed.  You become short of breath.  You vomit blood.  You have difficulty or pain with swallowing.  You have bloody or black, tarry stools.  You have episodes of heartburn more than 3 times a week, for more than 2 weeks. MAKE SURE YOU:  Understand these instructions.  Will watch your condition.  Will get help right away if you are not doing well or get worse.   This information is not intended to replace advice given to you  by your health care provider. Make sure you discuss any questions you have with your health care provider.   Document Released: 03/01/2000 Document Revised: 03/25/2014 Document Reviewed: 10/21/2012 Elsevier Interactive Patient Education 2016 [employer]. Preterm Labor Information Preterm labor is when labor starts at less than 37 weeks of pregnancy. The normal length of a pregnancy is 39 to 41 weeks. CAUSES Often, there is no identifiable underlying cause as to why a woman goes into preterm labor. One of the most common known causes of preterm labor is infection. Infections of the uterus, cervix, vagina, amniotic sac, bladder, kidney, or even the lungs (pneumonia) can cause labor to start. Other suspected causes of preterm labor include:   Urogenital infections,  such as yeast infections and bacterial vaginosis.   Uterine abnormalities (uterine shape, uterine septum, fibroids, or bleeding from the placenta).   A cervix that has been operated on (it may fail to stay closed).   Malformations in the fetus.   Multiple gestations (twins, triplets, and so on).   Breakage of the amniotic sac.  RISK FACTORS  Having a previous history of preterm labor.   Having premature rupture of membranes (PROM).   Having a placenta that covers the opening of the cervix (placenta previa).   Having a placenta that separates from the uterus (placental abruption).   Having a cervix that is too weak to hold the fetus in the uterus (incompetent cervix).   Having too much fluid in the amniotic sac (polyhydramnios).   Taking illegal drugs or smoking while pregnant.   Not gaining enough weight while pregnant.   Being younger than 3218 and older than 37 years old.   Having a low socioeconomic status.   Being African American. SYMPTOMS Signs and symptoms of preterm labor include:   Menstrual-like cramps, abdominal pain, or back pain.  Uterine contractions that are regular, as frequent  as six in an hour, regardless of their intensity (may be mild or painful).  Contractions that start on the top of the uterus and spread down to the lower abdomen and back.   A sense of increased pelvic pressure.   A watery or bloody mucus discharge that comes from the vagina.  TREATMENT Depending on the length of the pregnancy and other circumstances, your health care provider may suggest bed rest. If necessary, there are medicines that can be given to stop contractions and to mature the fetal lungs. If labor happens before 34 weeks of pregnancy, a prolonged hospital stay may be recommended. Treatment depends on the condition of both you and the fetus.  WHAT SHOULD YOU DO IF YOU THINK YOU ARE IN PRETERM LABOR? Call your health care provider right away. You will need to go to the hospital to get checked immediately. HOW CAN YOU PREVENT PRETERM LABOR IN FUTURE PREGNANCIES? You should:   Stop smoking if you smoke.  Maintain healthy weight gain and avoid chemicals and drugs that are not necessary.  Be watchful for any type of infection.  Inform your health care provider if you have a known history of preterm labor.   This information is not intended to replace advice given to you by your health care provider. Make sure you discuss any questions you have with your health care provider.   Document Released: 05/25/2003 Document Revised: 11/04/2012 Document Reviewed: 04/06/2012 Elsevier Interactive Patient Education 2016 [employer]  Second Trimester of Pregnancy The second trimester is from week 13 through week 28, month 4 through 6. This is often the time in pregnancy that you feel your best. Often times, morning sickness has lessened or quit. You may have more energy, and you may get hungry more often. Your unborn baby (fetus) is growing rapidly. At the end of the sixth month, he or she is about 9 inches long and weighs about 1 pounds. You will likely feel the baby move (quickening)  between 18 and 20 weeks of pregnancy. HOME CARE   Avoid all smoking, herbs, and alcohol. Avoid drugs not approved by your doctor.  Do not use any tobacco products, including cigarettes, chewing tobacco, and electronic cigarettes. If you need help quitting, ask your doctor. You may get counseling or other support to help you quit.  Only take  medicine as told by your doctor. Some medicines are safe and some are not during pregnancy.  Exercise only as told by your doctor. Stop exercising if you start having cramps.  Eat regular, healthy meals.  Wear a good support bra if your breasts are tender.  Do not use hot tubs, steam rooms, or saunas.  Wear your seat belt when driving.  Avoid raw meat, uncooked cheese, and liter boxes and soil used by cats.  Take your prenatal vitamins.  Take 1500-2000 milligrams of calcium daily starting at the 20th week of pregnancy until you deliver your baby.  Try taking medicine that helps you poop (stool softener) as needed, and if your doctor approves. Eat more fiber by eating fresh fruit, vegetables, and whole grains. Drink enough fluids to keep your pee (urine) clear or pale yellow.  Take warm water baths (sitz baths) to soothe pain or discomfort caused by hemorrhoids. Use hemorrhoid cream if your doctor approves.  If you have puffy, bulging veins (varicose veins), wear support hose. Raise (elevate) your feet for 15 minutes, 3-4 times a day. Limit salt in your diet.  Avoid heavy lifting, wear low heals, and sit up straight.  Rest with your legs raised if you have leg cramps or low back pain.  Visit your dentist if you have not gone during your pregnancy. Use a soft toothbrush to brush your teeth. Be gentle when you floss.  You can have sex (intercourse) unless your doctor tells you not to.  Go to your doctor visits. GET HELP IF:   You feel dizzy.  You have mild cramps or pressure in your lower belly (abdomen).  You have a nagging pain in your  belly area.  You continue to feel sick to your stomach (nauseous), throw up (vomit), or have watery poop (diarrhea).  You have bad smelling fluid coming from your vagina.  You have pain with peeing (urination). GET HELP RIGHT AWAY IF:   You have a fever.  You are leaking fluid from your vagina.  You have spotting or bleeding from your vagina.  You have severe belly cramping or pain.  You lose or gain weight rapidly.  You have trouble catching your breath and have chest pain.  You notice sudden or extreme puffiness (swelling) of your face, hands, ankles, feet, or legs.  You have not felt the baby move in over an hour.  You have severe headaches that do not go away with medicine.  You have vision changes.   This information is not intended to replace advice given to you by your health care provider. Make sure you discuss any questions you have with your health care provider.   Document Released: 05/29/2009 Document Revised: 03/25/2014 Document Reviewed: 05/05/2012 Elsevier Interactive Patient Education [employer]

## 2015-11-29 ENCOUNTER — Other Ambulatory Visit: Payer: Self-pay | Admitting: Certified Nurse Midwife

## 2015-11-29 DIAGNOSIS — O0992 Supervision of high risk pregnancy, unspecified, second trimester: Secondary | ICD-10-CM

## 2015-11-29 LAB — HIV ANTIBODY (ROUTINE TESTING W REFLEX): HIV Screen 4th Generation wRfx: NONREACTIVE

## 2015-11-29 LAB — GLUCOSE TOLERANCE, 2 HOURS W/ 1HR
GLUCOSE, FASTING: 86 mg/dL (ref 65–91)
Glucose, 1 hour: 161 mg/dL (ref 65–179)
Glucose, 2 hour: 116 mg/dL (ref 65–152)

## 2015-11-29 LAB — CBC
HEMATOCRIT: 33.7 % — AB (ref 34.0–46.6)
Hemoglobin: 11.3 g/dL (ref 11.1–15.9)
MCH: 30.2 pg (ref 26.6–33.0)
MCHC: 33.5 g/dL (ref 31.5–35.7)
MCV: 90 fL (ref 79–97)
PLATELETS: 215 10*3/uL (ref 150–379)
RBC: 3.74 x10E6/uL — ABNORMAL LOW (ref 3.77–5.28)
RDW: 13.9 % (ref 12.3–15.4)
WBC: 9.9 10*3/uL (ref 3.4–10.8)

## 2015-11-29 LAB — RPR: RPR Ser Ql: NONREACTIVE

## 2015-11-30 LAB — CULTURE, OB URINE

## 2015-11-30 LAB — URINE CULTURE, OB REFLEX

## 2015-12-01 ENCOUNTER — Telehealth: Payer: Self-pay | Admitting: *Deleted

## 2015-12-01 NOTE — Telephone Encounter (Signed)
Call came to office from [facility] for refill on generic Zantac per pt request.  Please advise and/or send refill.

## 2015-12-07 ENCOUNTER — Other Ambulatory Visit: Payer: Self-pay | Admitting: Advanced Practice Midwife

## 2015-12-07 DIAGNOSIS — O09522 Supervision of elderly multigravida, second trimester: Secondary | ICD-10-CM

## 2015-12-07 MED ORDER — RANITIDINE HCL 150 MG PO CAPS: 150.0000 mg | ORAL_CAPSULE | Freq: Two times a day (BID) | ORAL | 2 refills | Status: DC | PRN

## 2015-12-08 ENCOUNTER — Telehealth: Payer: Self-pay | Admitting: *Deleted

## 2015-12-08 NOTE — Telephone Encounter (Signed)
Spoke with pt regarding Rx that were sent to her pharmacy. Pt would like to know if she could get a refill on her nausea mediation, Zofran. Pt advised request will be sent to prescribing provider.  Please advise.

## 2015-12-08 NOTE — Telephone Encounter (Signed)
Pt has been made aware Rx at pharmacy.

## 2015-12-10 ENCOUNTER — Other Ambulatory Visit: Payer: Self-pay | Admitting: Certified Nurse Midwife

## 2015-12-10 DIAGNOSIS — O09522 Supervision of elderly multigravida, second trimester: Secondary | ICD-10-CM

## 2015-12-10 MED ORDER — ONDANSETRON HCL 4 MG PO TABS: 4.0000 mg | ORAL_TABLET | Freq: Every day | ORAL | 1 refills | Status: DC | PRN

## 2015-12-10 NOTE — Telephone Encounter (Signed)
Refill sent to the pharmacy.  Thank you.  R.Edin Skarda CNM

## 2015-12-19 ENCOUNTER — Ambulatory Visit (INDEPENDENT_AMBULATORY_CARE_PROVIDER_SITE_OTHER): Payer: 59 | Admitting: Certified Nurse Midwife

## 2015-12-19 ENCOUNTER — Encounter: Payer: Self-pay | Admitting: *Deleted

## 2015-12-19 VITALS — BP 115/76 | HR 86

## 2015-12-19 DIAGNOSIS — Z23 Encounter for immunization: Secondary | ICD-10-CM | POA: Diagnosis not present

## 2015-12-19 DIAGNOSIS — O0992 Supervision of high risk pregnancy, unspecified, second trimester: Secondary | ICD-10-CM | POA: Diagnosis not present

## 2015-12-19 DIAGNOSIS — O09523 Supervision of elderly multigravida, third trimester: Secondary | ICD-10-CM

## 2015-12-19 NOTE — Patient Instructions (Addendum)
Before Baby Comes Home Before your baby arrives it is important to:  Have all of the supplies that you will need to care for your baby.  Know where to go if there is an emergency.  Discuss the baby's arrival with other family members. WHAT SUPPLIES WILL I NEED? It is recommended that you have the following supplies: Large Items  Crib.  Crib mattress.  Rear-facing infant car seat. If possible, have a trained professional check to make sure that it is installed correctly. Feeding  6-8 bottles that are 4-5 oz in size.  6-8 nipples.  Bottle brush.  Sterilizer, or a large pan or kettle with a lid.  A way to boil and cool water.  If you will be breastfeeding:  Breast pump.  Nipple cream.  Nursing bra.  Breast pads.  Breast shields.  If you will be formula feeding:  Formula.  Measuring cups.  Measuring spoons. Bathing  Mild baby soap and baby shampoo.  Petroleum jelly.  Soft cloth towel and washcloth.  Hooded towel.  Cotton balls.  Bath basin. Other Supplies  Rectal thermometer.  Bulb syringe.  Baby wipes or washcloths for diaper changes.  Diaper bag.  Changing pad.  Clothing, including one-piece outfits and pajamas.  Baby nail clippers.  Receiving blankets.  Mattress pad and sheets for the crib.  Night-light for the baby's room.  Baby monitor.  2 or 3 pacifiers.  Either 24-36 cloth diapers and waterproof diaper covers or a box of disposable diapers. You may need to use as many as 10-12 diapers per day. HOW DO I PREPARE FOR AN EMERGENCY? Prepare for an emergency by:  Knowing how to get to the nearest hospital.  Listing the phone numbers of your baby's health care providers near your home phone and in your cell phone. HOW DO I PREPARE MY FAMILY?  Decide how to handle visitors.  If you have other children:  Talk with them about the baby coming home. Ask them how they feel about it.  Read a book together about being a new big  brother or sister.  Find ways to let them help you prepare for the new baby.  Have someone ready to care for them while you are in the hospital.   This information is not intended to replace advice given to you by your health care provider. Make sure you discuss any questions you have with your health care provider.   Document Released: 02/15/2008 Document Revised: 07/19/2014 Document Reviewed: 02/09/2014 Elsevier Interactive Patient Education 2016 [employer] Third Trimester of Pregnancy The third trimester is from week 29 through week 42, months 7 through 9. The third trimester is a time when the fetus is growing rapidly. At the end of the ninth month, the fetus is about 20 inches in length and weighs 6-10 pounds.  BODY CHANGES Your body goes through many changes during pregnancy. The changes vary from woman to woman.   Your weight will continue to increase. You can expect to gain 25-35 pounds (11-16 kg) by the end of the pregnancy.  You may begin to get stretch marks on your hips, abdomen, and breasts.  You may urinate more often because the fetus is moving lower into your pelvis and pressing on your bladder.  You may develop or continue to have heartburn as a result of your pregnancy.  You may develop constipation because certain hormones are causing the muscles that push waste through your intestines to slow down.  You may develop hemorrhoids or   swollen, bulging veins (varicose veins).  You may have pelvic pain because of the weight gain and pregnancy hormones relaxing your joints between the bones in your pelvis. Backaches may result from overexertion of the muscles supporting your posture.  You may have changes in your hair. These can include thickening of your hair, rapid growth, and changes in texture. Some women also have hair loss during or after pregnancy, or hair that feels dry or thin. Your hair will most likely return to normal after your baby is born.  Your breasts  will continue to grow and be tender. A yellow discharge may leak from your breasts called colostrum.  Your belly button may stick out.  You may feel short of breath because of your expanding uterus.  You may notice the fetus "dropping," or moving lower in your abdomen.  You may have a bloody mucus discharge. This usually occurs a few days to a week before labor begins.  Your cervix becomes thin and soft (effaced) near your due date. WHAT TO EXPECT AT YOUR PRENATAL EXAMS  You will have prenatal exams every 2 weeks until week 36. Then, you will have weekly prenatal exams. During a routine prenatal visit:  You will be weighed to make sure you and the fetus are growing normally.  Your blood pressure is taken.  Your abdomen will be measured to track your baby's growth.  The fetal heartbeat will be listened to.  Any test results from the previous visit will be discussed.  You may have a cervical check near your due date to see if you have effaced. At around 36 weeks, your caregiver will check your cervix. At the same time, your caregiver will also perform a test on the secretions of the vaginal tissue. This test is to determine if a type of bacteria, Group B streptococcus, is present. Your caregiver will explain this further. Your caregiver may ask you:  What your birth plan is.  How you are feeling.  If you are feeling the baby move.  If you have had any abnormal symptoms, such as leaking fluid, bleeding, severe headaches, or abdominal cramping.  If you are using any tobacco products, including cigarettes, chewing tobacco, and electronic cigarettes.  If you have any questions. Other tests or screenings that may be performed during your third trimester include:  Blood tests that check for low iron levels (anemia).  Fetal testing to check the health, activity level, and growth of the fetus. Testing is done if you have certain medical conditions or if there are problems during the  pregnancy.  HIV (human immunodeficiency virus) testing. If you are at high risk, you may be screened for HIV during your third trimester of pregnancy. FALSE LABOR You may feel small, irregular contractions that eventually go away. These are called Braxton Hicks contractions, or false labor. Contractions may last for hours, days, or even weeks before true labor sets in. If contractions come at regular intervals, intensify, or become painful, it is best to be seen by your caregiver.  SIGNS OF LABOR   Menstrual-like cramps.  Contractions that are 5 minutes apart or less.  Contractions that start on the top of the uterus and spread down to the lower abdomen and back.  A sense of increased pelvic pressure or back pain.  A watery or bloody mucus discharge that comes from the vagina. If you have any of these signs before the 37th week of pregnancy, call your caregiver right away. You need to go to   the hospital to get checked immediately. HOME CARE INSTRUCTIONS   Avoid all smoking, herbs, alcohol, and unprescribed drugs. These chemicals affect the formation and growth of the baby.  Do not use any tobacco products, including cigarettes, chewing tobacco, and electronic cigarettes. If you need help quitting, ask your health care provider. You may receive counseling support and other resources to help you quit.  Follow your caregiver's instructions regarding medicine use. There are medicines that are either safe or unsafe to take during pregnancy.  Exercise only as directed by your caregiver. Experiencing uterine cramps is a good sign to stop exercising.  Continue to eat regular, healthy meals.  Wear a good support bra for breast tenderness.  Do not use hot tubs, steam rooms, or saunas.  Wear your seat belt at all times when driving.  Avoid raw meat, uncooked cheese, cat litter boxes, and soil used by cats. These carry germs that can cause birth defects in the baby.  Take your prenatal  vitamins.  Take 1500-2000 mg of calcium daily starting at the 20th week of pregnancy until you deliver your baby.  Try taking a stool softener (if your caregiver approves) if you develop constipation. Eat more high-fiber foods, such as fresh vegetables or fruit and whole grains. Drink plenty of fluids to keep your urine clear or pale yellow.  Take warm sitz baths to soothe any pain or discomfort caused by hemorrhoids. Use hemorrhoid cream if your caregiver approves.  If you develop varicose veins, wear support hose. Elevate your feet for 15 minutes, 3-4 times a day. Limit salt in your diet.  Avoid heavy lifting, wear low heal shoes, and practice good posture.  Rest a lot with your legs elevated if you have leg cramps or low back pain.  Visit your dentist if you have not gone during your pregnancy. Use a soft toothbrush to brush your teeth and be gentle when you floss.  A sexual relationship may be continued unless your caregiver directs you otherwise.  Do not travel far distances unless it is absolutely necessary and only with the approval of your caregiver.  Take prenatal classes to understand, practice, and ask questions about the labor and delivery.  Make a trial run to the hospital.  Pack your hospital bag.  Prepare the baby's nursery.  Continue to go to all your prenatal visits as directed by your caregiver. SEEK MEDICAL CARE IF:  You are unsure if you are in labor or if your water has broken.  You have dizziness.  You have mild pelvic cramps, pelvic pressure, or nagging pain in your abdominal area.  You have persistent nausea, vomiting, or diarrhea.  You have a bad smelling vaginal discharge.  You have pain with urination. SEEK IMMEDIATE MEDICAL CARE IF:   You have a fever.  You are leaking fluid from your vagina.  You have spotting or bleeding from your vagina.  You have severe abdominal cramping or pain.  You have rapid weight loss or gain.  You have  shortness of breath with chest pain.  You notice sudden or extreme swelling of your face, hands, ankles, feet, or legs.  You have not felt your baby move in over an hour.  You have severe headaches that do not go away with medicine.  You have vision changes.   This information is not intended to replace advice given to you by your health care provider. Make sure you discuss any questions you have with your health care provider.   Document   Released: 02/26/2001 Document Revised: 03/25/2014 Document Reviewed: 05/05/2012 Elsevier Interactive Patient Education 2016 [employer] Preterm Labor Information Preterm labor is when labor starts at less than 37 weeks of pregnancy. The normal length of a pregnancy is 39 to 41 weeks. CAUSES Often, there is no identifiable underlying cause as to why a woman goes into preterm labor. One of the most common known causes of preterm labor is infection. Infections of the uterus, cervix, vagina, amniotic sac, bladder, kidney, or even the lungs (pneumonia) can cause labor to start. Other suspected causes of preterm labor include:   Urogenital infections, such as yeast infections and bacterial vaginosis.   Uterine abnormalities (uterine shape, uterine septum, fibroids, or bleeding from the placenta).   A cervix that has been operated on (it may fail to stay closed).   Malformations in the fetus.   Multiple gestations (twins, triplets, and so on).   Breakage of the amniotic sac.  RISK FACTORS  Having a previous history of preterm labor.   Having premature rupture of membranes (PROM).   Having a placenta that covers the opening of the cervix (placenta previa).   Having a placenta that separates from the uterus (placental abruption).   Having a cervix that is too weak to hold the fetus in the uterus (incompetent cervix).   Having too much fluid in the amniotic sac (polyhydramnios).   Taking illegal drugs or smoking while pregnant.    Not gaining enough weight while pregnant.   Being younger than 18 and older than 37 years old.   Having a low socioeconomic status.   Being African American. SYMPTOMS Signs and symptoms of preterm labor include:   Menstrual-like cramps, abdominal pain, or back pain.  Uterine contractions that are regular, as frequent as six in an hour, regardless of their intensity (may be mild or painful).  Contractions that start on the top of the uterus and spread down to the lower abdomen and back.   A sense of increased pelvic pressure.   A watery or bloody mucus discharge that comes from the vagina.  TREATMENT Depending on the length of the pregnancy and other circumstances, your health care provider may suggest bed rest. If necessary, there are medicines that can be given to stop contractions and to mature the fetal lungs. If labor happens before 34 weeks of pregnancy, a prolonged hospital stay may be recommended. Treatment depends on the condition of both you and the fetus.  WHAT SHOULD YOU DO IF YOU THINK YOU ARE IN PRETERM LABOR? Call your health care provider right away. You will need to go to the hospital to get checked immediately. HOW CAN YOU PREVENT PRETERM LABOR IN FUTURE PREGNANCIES? You should:   Stop smoking if you smoke.  Maintain healthy weight gain and avoid chemicals and drugs that are not necessary.  Be watchful for any type of infection.  Inform your health care provider if you have a known history of preterm labor.   This information is not intended to replace advice given to you by your health care provider. Make sure you discuss any questions you have with your health care provider.   Document Released: 05/25/2003 Document Revised: 11/04/2012 Document Reviewed: 04/06/2012 Elsevier Interactive Patient Education 2016 [employer] Inc[employer]  

## 2015-12-19 NOTE — Progress Notes (Signed)
Subjective:    Dana Cole is a 35 y.o. female being seen today for her obstetrical visit. She is at 647w5d gestation. Patient reports backache, fatigue, no bleeding, no contractions, no cramping and no leaking. Fetal movement: normal.  Problem List Items Addressed This Visit      Other   Supervision of high risk pregnancy in second trimester   Relevant Orders   Tdap vaccine greater than or equal to 37yo IM (Completed)    Other Visit Diagnoses    Advanced maternal age in multigravida, third trimester    -  Primary     Patient Active Problem List   Diagnosis Date Noted  . Supervision of high risk pregnancy in second trimester 10/31/2015  . AMA (advanced maternal age) multigravida 35+ 09/06/2015  . Late prenatal care affecting pregnancy in second trimester, antepartum 09/06/2015  . Surgical history of tubal ligation 09/06/2015  . Leukocytosis 08/07/2015   Objective:    BP 115/76   Pulse 86   LMP 05/22/2015 (LMP Unknown)  FHT:  150 BPM  Uterine Size: 30 cm and size equals dates  Presentation: cephalic     Assessment:    Pregnancy @ 6647w5d weeks   AMA   Plan:    Work letter to decrease work hours to no more than 6 hours per day d/t pregnancy discomforts   labs reviewed, problem list updated Consent signed. GBS planning TDAP offered  Rhogam given for RH negative Pediatrician: discussed. Infant feeding: plans to breastfeed. Maternity leave: discussed, forms done. Cigarette smoking: quit at start of pregnancy. Orders Placed This Encounter  Procedures  . Tdap vaccine greater than or equal to 37yo IM   No orders of the defined types were placed in this encounter.  Follow up in 2 Weeks.

## 2016-01-03 ENCOUNTER — Ambulatory Visit (INDEPENDENT_AMBULATORY_CARE_PROVIDER_SITE_OTHER): Payer: 59 | Admitting: Certified Nurse Midwife

## 2016-01-03 VITALS — BP 125/83 | HR 90 | Temp 98.0°F | Wt 283.0 lb

## 2016-01-03 DIAGNOSIS — O0993 Supervision of high risk pregnancy, unspecified, third trimester: Secondary | ICD-10-CM

## 2016-01-03 DIAGNOSIS — O09523 Supervision of elderly multigravida, third trimester: Secondary | ICD-10-CM | POA: Diagnosis not present

## 2016-01-03 NOTE — Progress Notes (Signed)
Subjective:    Dana Cole is a 35 y.o. female being seen today for her obstetrical visit. She is at 5845w6d gestation. Patient reports backache, no bleeding, no contractions, no cramping, no leaking and SOB with activity. Fetal movement: normal.  Problem List Items Addressed This Visit      Other   Supervision of high risk pregnancy in second trimester    Other Visit Diagnoses    Advanced maternal age in multigravida, third trimester    -  Primary     Patient Active Problem List   Diagnosis Date Noted  . Supervision of high risk pregnancy in second trimester 10/31/2015  . AMA (advanced maternal age) multigravida 35+ 09/06/2015  . Late prenatal care affecting pregnancy in second trimester, antepartum 09/06/2015  . Surgical history of tubal ligation 09/06/2015  . Leukocytosis 08/07/2015   Objective:    BP 125/83   Pulse 90   Temp 98 F (36.7 C)   Wt 283 lb (128.4 kg)   LMP 05/22/2015 (LMP Unknown)   BMI 41.79 kg/m  FHT:  136 BPM  Uterine Size: 36 cm and size greater than dates  Presentation: cephalic     Assessment:    Pregnancy @ 5645w6d weeks   Plan:     labs reviewed, problem list updated Consent signed. GBS sent TDAP offered  Rhogam given for RH negative Pediatrician: discussed. Infant feeding: plans to breastfeed. Maternity leave: discussed. Cigarette smoking: never smoked. No orders of the defined types were placed in this encounter.  Meds ordered this encounter  Medications  . ranitidine (ZANTAC) 150 MG tablet    Sig: TAKE 1 TABLET (150 MG TOTAL) BY MOUTH 2 (TWO) TIMES DAILY AS NEEDED FOR HEARTBURN.    Refill:  2   Follow up in 2 Weeks.

## 2016-01-03 NOTE — Patient Instructions (Addendum)
Third Trimester of Pregnancy The third trimester is from week 29 through week 42, months 7 through 9. The third trimester is a time when the fetus is growing rapidly. At the end of the ninth month, the fetus is about 20 inches in length and weighs 6-10 pounds.  BODY CHANGES Your body goes through many changes during pregnancy. The changes vary from woman to woman.   Your weight will continue to increase. You can expect to gain 25-35 pounds (11-16 kg) by the end of the pregnancy.  You may begin to get stretch marks on your hips, abdomen, and breasts.  You may urinate more often because the fetus is moving lower into your pelvis and pressing on your bladder.  You may develop or continue to have heartburn as a result of your pregnancy.  You may develop constipation because certain hormones are causing the muscles that push waste through your intestines to slow down.  You may develop hemorrhoids or swollen, bulging veins (varicose veins).  You may have pelvic pain because of the weight gain and pregnancy hormones relaxing your joints between the bones in your pelvis. Backaches may result from overexertion of the muscles supporting your posture.  You may have changes in your hair. These can include thickening of your hair, rapid growth, and changes in texture. Some women also have hair loss during or after pregnancy, or hair that feels dry or thin. Your hair will most likely return to normal after your baby is born.  Your breasts will continue to grow and be tender. A yellow discharge may leak from your breasts called colostrum.  Your belly button may stick out.  You may feel short of breath because of your expanding uterus.  You may notice the fetus "dropping," or moving lower in your abdomen.  You may have a bloody mucus discharge. This usually occurs a few days to a week before labor begins.  Your cervix becomes thin and soft (effaced) near your due date. WHAT TO EXPECT AT YOUR PRENATAL  EXAMS  You will have prenatal exams every 2 weeks until week 36. Then, you will have weekly prenatal exams. During a routine prenatal visit:  You will be weighed to make sure you and the fetus are growing normally.  Your blood pressure is taken.  Your abdomen will be measured to track your baby's growth.  The fetal heartbeat will be listened to.  Any test results from the previous visit will be discussed.  You may have a cervical check near your due date to see if you have effaced. At around 36 weeks, your caregiver will check your cervix. At the same time, your caregiver will also perform a test on the secretions of the vaginal tissue. This test is to determine if a type of bacteria, Group B streptococcus, is present. Your caregiver will explain this further. Your caregiver may ask you:  What your birth plan is.  How you are feeling.  If you are feeling the baby move.  If you have had any abnormal symptoms, such as leaking fluid, bleeding, severe headaches, or abdominal cramping.  If you are using any tobacco products, including cigarettes, chewing tobacco, and electronic cigarettes.  If you have any questions. Other tests or screenings that may be performed during your third trimester include:  Blood tests that check for low iron levels (anemia).  Fetal testing to check the health, activity level, and growth of the fetus. Testing is done if you have certain medical conditions or if   there are problems during the pregnancy.  HIV (human immunodeficiency virus) testing. If you are at high risk, you may be screened for HIV during your third trimester of pregnancy. FALSE LABOR You may feel small, irregular contractions that eventually go away. These are called Braxton Hicks contractions, or false labor. Contractions may last for hours, days, or even weeks before true labor sets in. If contractions come at regular intervals, intensify, or become painful, it is best to be seen by your  caregiver.  SIGNS OF LABOR   Menstrual-like cramps.  Contractions that are 5 minutes apart or less.  Contractions that start on the top of the uterus and spread down to the lower abdomen and back.  A sense of increased pelvic pressure or back pain.  A watery or bloody mucus discharge that comes from the vagina. If you have any of these signs before the 37th week of pregnancy, call your caregiver right away. You need to go to the hospital to get checked immediately. HOME CARE INSTRUCTIONS   Avoid all smoking, herbs, alcohol, and unprescribed drugs. These chemicals affect the formation and growth of the baby.  Do not use any tobacco products, including cigarettes, chewing tobacco, and electronic cigarettes. If you need help quitting, ask your health care provider. You may receive counseling support and other resources to help you quit.  Follow your caregiver's instructions regarding medicine use. There are medicines that are either safe or unsafe to take during pregnancy.  Exercise only as directed by your caregiver. Experiencing uterine cramps is a good sign to stop exercising.  Continue to eat regular, healthy meals.  Wear a good support bra for breast tenderness.  Do not use hot tubs, steam rooms, or saunas.  Wear your seat belt at all times when driving.  Avoid raw meat, uncooked cheese, cat litter boxes, and soil used by cats. These carry germs that can cause birth defects in the baby.  Take your prenatal vitamins.  Take 1500-2000 mg of calcium daily starting at the 20th week of pregnancy until you deliver your baby.  Try taking a stool softener (if your caregiver approves) if you develop constipation. Eat more high-fiber foods, such as fresh vegetables or fruit and whole grains. Drink plenty of fluids to keep your urine clear or pale yellow.  Take warm sitz baths to soothe any pain or discomfort caused by hemorrhoids. Use hemorrhoid cream if your caregiver approves.  If  you develop varicose veins, wear support hose. Elevate your feet for 15 minutes, 3-4 times a day. Limit salt in your diet.  Avoid heavy lifting, wear low heal shoes, and practice good posture.  Rest a lot with your legs elevated if you have leg cramps or low back pain.  Visit your dentist if you have not gone during your pregnancy. Use a soft toothbrush to brush your teeth and be gentle when you floss.  A sexual relationship may be continued unless your caregiver directs you otherwise.  Do not travel far distances unless it is absolutely necessary and only with the approval of your caregiver.  Take prenatal classes to understand, practice, and ask questions about the labor and delivery.  Make a trial run to the hospital.  Pack your hospital bag.  Prepare the baby's nursery.  Continue to go to all your prenatal visits as directed by your caregiver. SEEK MEDICAL CARE IF:  You are unsure if you are in labor or if your water has broken.  You have dizziness.  You have   mild pelvic cramps, pelvic pressure, or nagging pain in your abdominal area.  You have persistent nausea, vomiting, or diarrhea.  You have a bad smelling vaginal discharge.  You have pain with urination. SEEK IMMEDIATE MEDICAL CARE IF:   You have a fever.  You are leaking fluid from your vagina.  You have spotting or bleeding from your vagina.  You have severe abdominal cramping or pain.  You have rapid weight loss or gain.  You have shortness of breath with chest pain.  You notice sudden or extreme swelling of your face, hands, ankles, feet, or legs.  You have not felt your baby move in over an hour.  You have severe headaches that do not go away with medicine.  You have vision changes.   This information is not intended to replace advice given to you by your health care provider. Make sure you discuss any questions you have with your health care provider.   Document Released: 02/26/2001 Document  Revised: 03/25/2014 Document Reviewed: 05/05/2012 Elsevier Interactive Patient Education 2016 [employer] Inc[employer]   Preterm Labor Information Preterm labor is when labor starts at less than 37 weeks of pregnancy. The normal length of a pregnancy is 39 to 41 weeks. CAUSES Often, there is no identifiable underlying cause as to why a woman goes into preterm labor. One of the most common known causes of preterm labor is infection. Infections of the uterus, cervix, vagina, amniotic sac, bladder, kidney, or even the lungs (pneumonia) can cause labor to start. Other suspected causes of preterm labor include:   Urogenital infections, such as yeast infections and bacterial vaginosis.   Uterine abnormalities (uterine shape, uterine septum, fibroids, or bleeding from the placenta).   A cervix that has been operated on (it may fail to stay closed).   Malformations in the fetus.   Multiple gestations (twins, triplets, and so on).   Breakage of the amniotic sac.  RISK FACTORS  Having a previous history of preterm labor.   Having premature rupture of membranes (PROM).   Having a placenta that covers the opening of the cervix (placenta previa).   Having a placenta that separates from the uterus (placental abruption).   Having a cervix that is too weak to hold the fetus in the uterus (incompetent cervix).   Having too much fluid in the amniotic sac (polyhydramnios).   Taking illegal drugs or smoking while pregnant.   Not gaining enough weight while pregnant.   Being younger than 18 and older than 37 years old.   Having a low socioeconomic status.   Being African American. SYMPTOMS Signs and symptoms of preterm labor include:   Menstrual-like cramps, abdominal pain, or back pain.  Uterine contractions that are regular, as frequent as six in an hour, regardless of their intensity (may be mild or painful).  Contractions that start on the top of the uterus and spread down to  the lower abdomen and back.   A sense of increased pelvic pressure.   A watery or bloody mucus discharge that comes from the vagina.  TREATMENT Depending on the length of the pregnancy and other circumstances, your health care provider may suggest bed rest. If necessary, there are medicines that can be given to stop contractions and to mature the fetal lungs. If labor happens before 34 weeks of pregnancy, a prolonged hospital stay may be recommended. Treatment depends on the condition of both you and the fetus.  WHAT SHOULD YOU DO IF YOU THINK YOU ARE IN PRETERM LABOR?   Call your health care provider right away. You will need to go to the hospital to get checked immediately. HOW CAN YOU PREVENT PRETERM LABOR IN FUTURE PREGNANCIES? You should:   Stop smoking if you smoke.  Maintain healthy weight gain and avoid chemicals and drugs that are not necessary.  Be watchful for any type of infection.  Inform your health care provider if you have a known history of preterm labor.   This information is not intended to replace advice given to you by your health care provider. Make sure you discuss any questions you have with your health care provider.   Document Released: 05/25/2003 Document Revised: 11/04/2012 Document Reviewed: 04/06/2012 Elsevier Interactive Patient Education 2016 Elsevier Inc.  

## 2016-01-09 ENCOUNTER — Ambulatory Visit (HOSPITAL_COMMUNITY)
Admission: RE | Admit: 2016-01-09 | Discharge: 2016-01-09 | Disposition: A | Discharge status: Home / Self Care | Payer: Medicaid Other | Source: Ambulatory Visit | Attending: Certified Nurse Midwife | Admitting: Certified Nurse Midwife

## 2016-01-09 ENCOUNTER — Other Ambulatory Visit (HOSPITAL_COMMUNITY): Payer: Self-pay | Admitting: *Deleted

## 2016-01-09 ENCOUNTER — Encounter (HOSPITAL_COMMUNITY): Payer: Self-pay

## 2016-01-09 DIAGNOSIS — O99213 Obesity complicating pregnancy, third trimester: Secondary | ICD-10-CM | POA: Diagnosis not present

## 2016-01-09 DIAGNOSIS — Z3A33 33 weeks gestation of pregnancy: Secondary | ICD-10-CM | POA: Insufficient documentation

## 2016-01-09 DIAGNOSIS — O09523 Supervision of elderly multigravida, third trimester: Secondary | ICD-10-CM | POA: Diagnosis not present

## 2016-01-09 DIAGNOSIS — O09529 Supervision of elderly multigravida, unspecified trimester: Secondary | ICD-10-CM

## 2016-01-10 ENCOUNTER — Encounter (HOSPITAL_COMMUNITY): Payer: Self-pay

## 2016-01-10 ENCOUNTER — Inpatient Hospital Stay (HOSPITAL_COMMUNITY): Payer: Medicaid Other

## 2016-01-10 ENCOUNTER — Inpatient Hospital Stay (HOSPITAL_COMMUNITY)
Admission: AD | Admit: 2016-01-10 | Discharge: 2016-01-10 | Disposition: A | Discharge status: Home / Self Care | Payer: Medicaid Other | Source: Ambulatory Visit | Attending: Family Medicine | Admitting: Family Medicine

## 2016-01-10 DIAGNOSIS — E860 Dehydration: Secondary | ICD-10-CM

## 2016-01-10 DIAGNOSIS — D72829 Elevated white blood cell count, unspecified: Secondary | ICD-10-CM | POA: Diagnosis not present

## 2016-01-10 DIAGNOSIS — N898 Other specified noninflammatory disorders of vagina: Secondary | ICD-10-CM | POA: Insufficient documentation

## 2016-01-10 DIAGNOSIS — Z3A33 33 weeks gestation of pregnancy: Secondary | ICD-10-CM | POA: Diagnosis not present

## 2016-01-10 DIAGNOSIS — Z87891 Personal history of nicotine dependence: Secondary | ICD-10-CM | POA: Diagnosis not present

## 2016-01-10 DIAGNOSIS — O288 Other abnormal findings on antenatal screening of mother: Secondary | ICD-10-CM

## 2016-01-10 DIAGNOSIS — O4703 False labor before 37 completed weeks of gestation, third trimester: Secondary | ICD-10-CM | POA: Diagnosis not present

## 2016-01-10 LAB — URINALYSIS, ROUTINE W REFLEX MICROSCOPIC
Bilirubin Urine: NEGATIVE
Glucose, UA: NEGATIVE mg/dL
Ketones, ur: 40 mg/dL — AB
NITRITE: NEGATIVE
PROTEIN: 30 mg/dL — AB
pH: 6.5 (ref 5.0–8.0)

## 2016-01-10 LAB — WET PREP, GENITAL
CLUE CELLS WET PREP: NONE SEEN
Sperm: NONE SEEN
Trich, Wet Prep: NONE SEEN
YEAST WET PREP: NONE SEEN

## 2016-01-10 LAB — URINE MICROSCOPIC-ADD ON: RBC / HPF: NONE SEEN RBC/hpf (ref 0–5)

## 2016-01-10 NOTE — MAU Note (Signed)
Patient states that she has been having ctxs all day 15-20 mins apart. Patient states that her panties were wet at 1430 and she doesn't think she peed on herself. Patient denies any bleeding. Fetus active.

## 2016-01-10 NOTE — Discharge Instructions (Signed)
Braxton Hicks Contractions °Contractions of the uterus can occur throughout pregnancy. Contractions are not always a sign that you are in labor.  °WHAT ARE BRAXTON HICKS CONTRACTIONS?  °Contractions that occur before labor are called Braxton Hicks contractions, or false labor. Toward the end of pregnancy (32-34 weeks), these contractions can develop more often and may become more forceful. This is not true labor because these contractions do not result in opening (dilatation) and thinning of the cervix. They are sometimes difficult to tell apart from true labor because these contractions can be forceful and people have different pain tolerances. You should not feel embarrassed if you go to the hospital with false labor. Sometimes, the only way to tell if you are in true labor is for your health care provider to look for changes in the cervix. °If there are no prenatal problems or other health problems associated with the pregnancy, it is completely safe to be sent home with false labor and await the onset of true labor. °HOW CAN YOU TELL THE DIFFERENCE BETWEEN TRUE AND FALSE LABOR? °False Labor °· The contractions of false labor are usually shorter and not as hard as those of true labor.   °· The contractions are usually irregular.   °· The contractions are often felt in the front of the lower abdomen and in the groin.   °· The contractions may go away when you walk around or change positions while lying down.   °· The contractions get weaker and are shorter lasting as time goes on.   °· The contractions do not usually become progressively stronger, regular, and closer together as with true labor.   °True Labor °· Contractions in true labor last 30-70 seconds, become very regular, usually become more intense, and increase in frequency.   °· The contractions do not go away with walking.   °· The discomfort is usually felt in the top of the uterus and spreads to the lower abdomen and low back.   °· True labor can be  determined by your health care provider with an exam. This will show that the cervix is dilating and getting thinner.   °WHAT TO REMEMBER °· Keep up with your usual exercises and follow other instructions given by your health care provider.   °· Take medicines as directed by your health care provider.   °· Keep your regular prenatal appointments.   °· Eat and drink lightly if you think you are going into labor.   °· If Braxton Hicks contractions are making you uncomfortable:   °¨ Change your position from lying down or resting to walking, or from walking to resting.   °¨ Sit and rest in a tub of warm water.   °¨ Drink 2-3 glasses of water. Dehydration may cause these contractions.   °¨ Do slow and deep breathing several times an hour.   °WHEN SHOULD I SEEK IMMEDIATE MEDICAL CARE? °Seek immediate medical care if: °· Your contractions become stronger, more regular, and closer together.   °· You have fluid leaking or gushing from your vagina.   °· You have a fever.   °· You pass blood-tinged mucus.   °· You have vaginal bleeding.   °· You have continuous abdominal pain.   °· You have low back pain that you never had before.   °· You feel your baby's head pushing down and causing pelvic pressure.   °· Your baby is not moving as much as it used to.   °  °This information is not intended to replace advice given to you by your health care provider. Make sure you discuss any questions you have with your health care   provider. °  °Document Released: 03/04/2005 Document Revised: 03/09/2013 Document Reviewed: 12/14/2012 °Elsevier Interactive Patient Education ©2016 Elsevier Inc. ° °

## 2016-01-10 NOTE — MAU Provider Note (Signed)
History     CSN: [id number]  Arrival date and time: 01/10/16 1525   None     Chief Complaint  Patient presents with  . Contractions   [id number] @33 .6 weeks here with ctx. She reports ctx q15 min that started last night then resolved and she was able to sleep. She reports ctx began again about 4 hrs ago and occurring q15 min. She also reports her underwear were wet about 2 hours ago and she hasn't seen any fluid since. Prior to today she did not have vaginal discharge, itching, or malodor. She reports good FM. No VB.     OB History    Gravida Para Term Preterm AB Living   6 4 3 1 1 4    SAB TAB Ectopic Multiple Live Births   1       4      Obstetric Comments   Child #3 was 5 weeks preterm.       Past Medical History:  Diagnosis Date  . Chlamydia   . Leukocytosis 08/07/2015  . Medical history non-contributory   . Trichomonas infection     Past Surgical History:  Procedure Laterality Date  . HIP SURGERY    . JOINT REPLACEMENT    . TUBAL LIGATION      Family History  Problem Relation Age of Onset  . Hypertension Other   . Diabetes Other     Social History  Substance Use Topics  . Smoking status: Former Smoker    Types: Cigarettes    Quit date: 06/07/2015  . Smokeless tobacco: Never Used  . Alcohol use Yes     Comment: occassional     Allergies:  Allergies  Allergen Reactions  . Shellfish Allergy Itching and Swelling    Prescriptions Prior to Admission  Medication Sig Dispense Refill Last Dose  . acetaminophen (TYLENOL) 325 MG tablet Take 650 mg by mouth every 6 (six) hours as needed for moderate pain.   Past Month at Unknown time  . calcium carbonate (TUMS - DOSED IN MG ELEMENTAL CALCIUM) 500 MG chewable tablet Chew 3-4 tablets by mouth 4 (four) times daily as needed for indigestion or heartburn.   01/09/2016 at Unknown time  . Prenat-FeAsp-Meth-FA-DHA w/o A (PRENATE PIXIE) 10-0.6-0.4-200 MG CAPS Take 1 tablet by mouth daily. 30 capsule 12 01/09/2016 at  Unknown time  . ranitidine (ZANTAC) 150 MG capsule Take 1 capsule (150 mg total) by mouth 2 (two) times daily as needed for heartburn. 60 capsule 2 Past Week at Unknown time  . ondansetron (ZOFRAN) 4 MG tablet Take 1 tablet (4 mg total) by mouth daily as needed. (Patient not taking: Reported on 01/10/2016) 30 tablet 1 Not Taking at Unknown time    Review of Systems  Constitutional: Negative.   Gastrointestinal: Positive for abdominal pain.  Genitourinary: Negative.    Physical Exam   Blood pressure 124/85, pulse 102, temperature 98.2 F (36.8 C), temperature source Oral, resp. rate 16, last menstrual period 05/22/2015, SpO2 100 %.  Physical Exam  Constitutional: She is oriented to person, place, and time. She appears well-developed and well-nourished.  HENT:  Head: Normocephalic and atraumatic.  Neck: Normal range of motion. Neck supple.  Cardiovascular: Normal rate.   Respiratory: Effort normal.  GI: Soft. She exhibits no distension. There is no tenderness.  Genitourinary:  Genitourinary Comments: External: no lesions Vagina: rugated, parous, scant mucous discharge, no pool, fern neg SVE: FT/thick  Musculoskeletal: Normal range of motion.  Neurological: She is alert and oriented  to person, place, and time.  Skin: Skin is warm and dry.  Psychiatric: She has a normal mood and affect.   EFM: 145 bpm, mod variability, no accels, no decels Toco: irritability Results for orders placed or performed during the hospital encounter of 01/10/16 (from the past 24 hour(s))  Urinalysis, Routine w reflex microscopic ([facility])     Status: Abnormal   Collection Time: 01/10/16  3:40 PM  Result Value Ref Range   Color, Urine YELLOW YELLOW   APPearance HAZY (A) CLEAR   Specific Gravity, Urine >1.030 (H) 1.005 - 1.030   pH 6.5 5.0 - 8.0   Glucose, UA NEGATIVE NEGATIVE mg/dL   Hgb urine dipstick TRACE (A) NEGATIVE   Bilirubin Urine NEGATIVE NEGATIVE   Ketones, ur 40 (A) NEGATIVE mg/dL    Protein, ur 30 (A) NEGATIVE mg/dL   Nitrite NEGATIVE NEGATIVE   Leukocytes, UA SMALL (A) NEGATIVE  Urine microscopic-add on     Status: Abnormal   Collection Time: 01/10/16  3:40 PM  Result Value Ref Range   Squamous Epithelial / LPF 6-30 (A) NONE SEEN   WBC, UA 6-30 0 - 5 WBC/hpf   RBC / HPF NONE SEEN 0 - 5 RBC/hpf   Bacteria, UA FEW (A) NONE SEEN  Wet prep, genital     Status: Abnormal   Collection Time: 01/10/16  4:25 PM  Result Value Ref Range   Yeast Wet Prep HPF POC NONE SEEN NONE SEEN   Trich, Wet Prep NONE SEEN NONE SEEN   Clue Cells Wet Prep HPF POC NONE SEEN NONE SEEN   WBC, Wet Prep HPF POC MANY (A) NONE SEEN   Sperm NONE SEEN     MAU Course  Procedures Po hydration  MDM Labs ordered and reviewed. No evidence of PROM, UTI, or infection. No signs of PTL. Dehydration likely causing uterine irritability. BPP 6/8 (off for breathing). Discussed presentation and clinical findings with Dr. Despina HiddenEure. Stable for discharge home, [facility].  Assessment and Plan   1. Preterm uterine contractions in third trimester, antepartum   2. Non-reactive NST (non-stress test)   3. Dehydration   4. Leukorrhea    [facility]charge home [facility] (message sent to pool) PTL precautions Return for worsening sx  Donette LarryMelanie Teo Moede, CNM 01/10/2016, 4:25 PM

## 2016-01-11 ENCOUNTER — Encounter: Payer: Self-pay | Admitting: *Deleted

## 2016-01-11 ENCOUNTER — Encounter (HOSPITAL_COMMUNITY): Payer: Self-pay

## 2016-01-11 ENCOUNTER — Inpatient Hospital Stay (HOSPITAL_COMMUNITY)
Admission: AD | Admit: 2016-01-11 | Discharge: 2016-01-11 | Disposition: A | Discharge status: Home / Self Care | Payer: Medicaid Other | Source: Ambulatory Visit | Attending: Obstetrics & Gynecology | Admitting: Obstetrics & Gynecology

## 2016-01-11 ENCOUNTER — Other Ambulatory Visit: Payer: Self-pay | Admitting: Certified Nurse Midwife

## 2016-01-11 DIAGNOSIS — O368130 Decreased fetal movements, third trimester, not applicable or unspecified: Secondary | ICD-10-CM | POA: Insufficient documentation

## 2016-01-11 DIAGNOSIS — O4703 False labor before 37 completed weeks of gestation, third trimester: Secondary | ICD-10-CM | POA: Diagnosis not present

## 2016-01-11 DIAGNOSIS — E860 Dehydration: Secondary | ICD-10-CM | POA: Diagnosis not present

## 2016-01-11 DIAGNOSIS — Z3A34 34 weeks gestation of pregnancy: Secondary | ICD-10-CM | POA: Insufficient documentation

## 2016-01-11 DIAGNOSIS — Z87891 Personal history of nicotine dependence: Secondary | ICD-10-CM | POA: Diagnosis not present

## 2016-01-11 DIAGNOSIS — O288 Other abnormal findings on antenatal screening of mother: Secondary | ICD-10-CM | POA: Diagnosis not present

## 2016-01-11 DIAGNOSIS — D72829 Elevated white blood cell count, unspecified: Secondary | ICD-10-CM | POA: Diagnosis not present

## 2016-01-11 DIAGNOSIS — R0989 Other specified symptoms and signs involving the circulatory and respiratory systems: Secondary | ICD-10-CM

## 2016-01-11 LAB — COMPREHENSIVE METABOLIC PANEL
ALK PHOS: 56 U/L (ref 38–126)
ALT: 16 U/L (ref 14–54)
ANION GAP: 7 (ref 5–15)
AST: 17 U/L (ref 15–41)
Albumin: 3.1 g/dL — ABNORMAL LOW (ref 3.5–5.0)
BILIRUBIN TOTAL: 0.9 mg/dL (ref 0.3–1.2)
BUN: 8 mg/dL (ref 6–20)
CALCIUM: 8.6 mg/dL — AB (ref 8.9–10.3)
CO2: 23 mmol/L (ref 22–32)
Chloride: 106 mmol/L (ref 101–111)
Creatinine, Ser: 0.5 mg/dL (ref 0.44–1.00)
GFR calc Af Amer: >60 mL/min (ref >60)
Glucose, Bld: 109 mg/dL — ABNORMAL HIGH (ref 65–99)
POTASSIUM: 3.2 mmol/L — AB (ref 3.5–5.1)
Sodium: 136 mmol/L (ref 135–145)
TOTAL PROTEIN: 7 g/dL (ref 6.5–8.1)

## 2016-01-11 LAB — CBC
HEMATOCRIT: 32.2 % — AB (ref 36.0–46.0)
Hemoglobin: 11.4 g/dL — ABNORMAL LOW (ref 12.0–15.0)
MCH: 30.6 pg (ref 26.0–34.0)
MCHC: 35.4 g/dL (ref 30.0–36.0)
MCV: 86.3 fL (ref 78.0–100.0)
Platelets: 204 10*3/uL (ref 150–400)
RBC: 3.73 MIL/uL — ABNORMAL LOW (ref 3.87–5.11)
RDW: 13.3 % (ref 11.5–15.5)
WBC: 8.5 10*3/uL (ref 4.0–10.5)

## 2016-01-11 LAB — GC/CHLAMYDIA PROBE AMP (~~LOC~~) NOT AT ARMC
Chlamydia: NEGATIVE
Neisseria Gonorrhea: POSITIVE — AB

## 2016-01-11 LAB — PROTEIN / CREATININE RATIO, URINE
CREATININE, URINE: 93 mg/dL
PROTEIN CREATININE RATIO: 0.2 mg/mg{creat} — AB (ref 0.00–0.15)
Total Protein, Urine: 19 mg/dL

## 2016-01-11 NOTE — Discharge Instructions (Signed)
Fetal Movement Counts °Patient Name: __________________________________________________ Patient Due Date: ____________________ °Performing a fetal movement count is highly recommended in high-risk pregnancies, but it is good for every pregnant woman to do. Your health care provider may ask you to start counting fetal movements at 28 weeks of the pregnancy. Fetal movements often increase: °· After eating a full meal. °· After physical activity. °· After eating or drinking something sweet or cold. °· At rest. °Pay attention to when you feel the baby is most active. This will help you notice a pattern of your baby's sleep and wake cycles and what factors contribute to an increase in fetal movement. It is important to perform a fetal movement count at the same time each day when your baby is normally most active.  °HOW TO COUNT FETAL MOVEMENTS °1. Find a quiet and comfortable area to sit or lie down on your left side. Lying on your left side provides the best blood and oxygen circulation to your baby. °2. Write down the day and time on a sheet of paper or in a journal. °3. Start counting kicks, flutters, swishes, rolls, or jabs in a 2-hour period. You should feel at least 10 movements within 2 hours. °4. If you do not feel 10 movements in 2 hours, wait 2-3 hours and count again. Look for a change in the pattern or not enough counts in 2 hours. °SEEK MEDICAL CARE IF: °· You feel less than 10 counts in 2 hours, tried twice. °· There is no movement in over an hour. °· The pattern is changing or taking longer each day to reach 10 counts in 2 hours. °· You feel the baby is not moving as he or she usually does. °Date: ____________ Movements: ____________ Start time: ____________ Finish time: ____________  °Date: ____________ Movements: ____________ Start time: ____________ Finish time: ____________ °Date: ____________ Movements: ____________ Start time: ____________ Finish time: ____________ °Date: ____________ Movements:  ____________ Start time: ____________ Finish time: ____________ °Date: ____________ Movements: ____________ Start time: ____________ Finish time: ____________ °Date: ____________ Movements: ____________ Start time: ____________ Finish time: ____________ °Date: ____________ Movements: ____________ Start time: ____________ Finish time: ____________ °Date: ____________ Movements: ____________ Start time: ____________ Finish time: ____________  °Date: ____________ Movements: ____________ Start time: ____________ Finish time: ____________ °Date: ____________ Movements: ____________ Start time: ____________ Finish time: ____________ °Date: ____________ Movements: ____________ Start time: ____________ Finish time: ____________ °Date: ____________ Movements: ____________ Start time: ____________ Finish time: ____________ °Date: ____________ Movements: ____________ Start time: ____________ Finish time: ____________ °Date: ____________ Movements: ____________ Start time: ____________ Finish time: ____________ °Date: ____________ Movements: ____________ Start time: ____________ Finish time: ____________  °Date: ____________ Movements: ____________ Start time: ____________ Finish time: ____________ °Date: ____________ Movements: ____________ Start time: ____________ Finish time: ____________ °Date: ____________ Movements: ____________ Start time: ____________ Finish time: ____________ °Date: ____________ Movements: ____________ Start time: ____________ Finish time: ____________ °Date: ____________ Movements: ____________ Start time: ____________ Finish time: ____________ °Date: ____________ Movements: ____________ Start time: ____________ Finish time: ____________ °Date: ____________ Movements: ____________ Start time: ____________ Finish time: ____________  °Date: ____________ Movements: ____________ Start time: ____________ Finish time: ____________ °Date: ____________ Movements: ____________ Start time: ____________ Finish  time: ____________ °Date: ____________ Movements: ____________ Start time: ____________ Finish time: ____________ °Date: ____________ Movements: ____________ Start time: ____________ Finish time: ____________ °Date: ____________ Movements: ____________ Start time: ____________ Finish time: ____________ °Date: ____________ Movements: ____________ Start time: ____________ Finish time: ____________ °Date: ____________ Movements: ____________ Start time: ____________ Finish time: ____________  °Date: ____________ Movements: ____________ Start time: ____________ Finish   time: ____________ °Date: ____________ Movements: ____________ Start time: ____________ Finish time: ____________ °Date: ____________ Movements: ____________ Start time: ____________ Finish time: ____________ °Date: ____________ Movements: ____________ Start time: ____________ Finish time: ____________ °Date: ____________ Movements: ____________ Start time: ____________ Finish time: ____________ °Date: ____________ Movements: ____________ Start time: ____________ Finish time: ____________ °Date: ____________ Movements: ____________ Start time: ____________ Finish time: ____________  °Date: ____________ Movements: ____________ Start time: ____________ Finish time: ____________ °Date: ____________ Movements: ____________ Start time: ____________ Finish time: ____________ °Date: ____________ Movements: ____________ Start time: ____________ Finish time: ____________ °Date: ____________ Movements: ____________ Start time: ____________ Finish time: ____________ °Date: ____________ Movements: ____________ Start time: ____________ Finish time: ____________ °Date: ____________ Movements: ____________ Start time: ____________ Finish time: ____________ °Date: ____________ Movements: ____________ Start time: ____________ Finish time: ____________  °Date: ____________ Movements: ____________ Start time: ____________ Finish time: ____________ °Date: ____________  Movements: ____________ Start time: ____________ Finish time: ____________ °Date: ____________ Movements: ____________ Start time: ____________ Finish time: ____________ °Date: ____________ Movements: ____________ Start time: ____________ Finish time: ____________ °Date: ____________ Movements: ____________ Start time: ____________ Finish time: ____________ °Date: ____________ Movements: ____________ Start time: ____________ Finish time: ____________ °Date: ____________ Movements: ____________ Start time: ____________ Finish time: ____________  °Date: ____________ Movements: ____________ Start time: ____________ Finish time: ____________ °Date: ____________ Movements: ____________ Start time: ____________ Finish time: ____________ °Date: ____________ Movements: ____________ Start time: ____________ Finish time: ____________ °Date: ____________ Movements: ____________ Start time: ____________ Finish time: ____________ °Date: ____________ Movements: ____________ Start time: ____________ Finish time: ____________ °Date: ____________ Movements: ____________ Start time: ____________ Finish time: ____________ °  °This information is not intended to replace advice given to you by your health care provider. Make sure you discuss any questions you have with your health care provider. °  °Document Released: 04/03/2006 Document Revised: 03/25/2014 Document Reviewed: 12/30/2011 °Elsevier Interactive Patient Education ©2016 [employer] °Third Trimester of Pregnancy °The third trimester is from week 29 through week 42, months 7 through 9. The third trimester is a time when the fetus is growing rapidly. At the end of the ninth month, the fetus is about 20 inches in length and weighs 6-10 pounds.  °BODY CHANGES °Your body goes through many changes during pregnancy. The changes vary from woman to woman.  °· Your weight will continue to increase. You can expect to gain 25-35 pounds (11-16 kg) by the end of the pregnancy. °· You may  begin to get stretch marks on your hips, abdomen, and breasts. °· You may urinate more often because the fetus is moving lower into your pelvis and pressing on your bladder. °· You may develop or continue to have heartburn as a result of your pregnancy. °· You may develop constipation because certain hormones are causing the muscles that push waste through your intestines to slow down. °· You may develop hemorrhoids or swollen, bulging veins (varicose veins). °· You may have pelvic pain because of the weight gain and pregnancy hormones relaxing your joints between the bones in your pelvis. Backaches may result from overexertion of the muscles supporting your posture. °· You may have changes in your hair. These can include thickening of your hair, rapid growth, and changes in texture. Some women also have hair loss during or after pregnancy, or hair that feels dry or thin. Your hair will most likely return to normal after your baby is born. °· Your breasts will continue to grow and be tender. A yellow discharge may leak from your breasts called colostrum. °· Your belly button may stick out. °·   You may feel short of breath because of your expanding uterus. °· You may notice the fetus "dropping," or moving lower in your abdomen. °· You may have a bloody mucus discharge. This usually occurs a few days to a week before labor begins. °· Your cervix becomes thin and soft (effaced) near your due date. °WHAT TO EXPECT AT YOUR PRENATAL EXAMS  °You will have prenatal exams every 2 weeks until week 36. Then, you will have weekly prenatal exams. During a routine prenatal visit: °· You will be weighed to make sure you and the fetus are growing normally. °· Your blood pressure is taken. °· Your abdomen will be measured to track your baby's growth. °· The fetal heartbeat will be listened to. °· Any test results from the previous visit will be discussed. °· You may have a cervical check near your due date to see if you have  effaced. °At around 36 weeks, your caregiver will check your cervix. At the same time, your caregiver will also perform a test on the secretions of the vaginal tissue. This test is to determine if a type of bacteria, Group B streptococcus, is present. Your caregiver will explain this further. °Your caregiver may ask you: °· What your birth plan is. °· How you are feeling. °· If you are feeling the baby move. °· If you have had any abnormal symptoms, such as leaking fluid, bleeding, severe headaches, or abdominal cramping. °· If you are using any tobacco products, including cigarettes, chewing tobacco, and electronic cigarettes. °· If you have any questions. °Other tests or screenings that may be performed during your third trimester include: °· Blood tests that check for low iron levels (anemia). °· Fetal testing to check the health, activity level, and growth of the fetus. Testing is done if you have certain medical conditions or if there are problems during the pregnancy. °· HIV (human immunodeficiency virus) testing. If you are at high risk, you may be screened for HIV during your third trimester of pregnancy. °FALSE LABOR °You may feel small, irregular contractions that eventually go away. These are called Braxton Hicks contractions, or false labor. Contractions may last for hours, days, or even weeks before true labor sets in. If contractions come at regular intervals, intensify, or become painful, it is best to be seen by your caregiver.  °SIGNS OF LABOR  °· Menstrual-like cramps. °· Contractions that are 5 minutes apart or less. °· Contractions that start on the top of the uterus and spread down to the lower abdomen and back. °· A sense of increased pelvic pressure or back pain. °· A watery or bloody mucus discharge that comes from the vagina. °If you have any of these signs before the 37th week of pregnancy, call your caregiver right away. You need to go to the hospital to get checked immediately. °HOME CARE  INSTRUCTIONS  °· Avoid all smoking, herbs, alcohol, and unprescribed drugs. These chemicals affect the formation and growth of the baby. °· Do not use any tobacco products, including cigarettes, chewing tobacco, and electronic cigarettes. If you need help quitting, ask your health care provider. You may receive counseling support and other resources to help you quit. °· Follow your caregiver's instructions regarding medicine use. There are medicines that are either safe or unsafe to take during pregnancy. °· Exercise only as directed by your caregiver. Experiencing uterine cramps is a good sign to stop exercising. °· Continue to eat regular, healthy meals. °· Wear a good support bra for   breast tenderness. °· Do not use hot tubs, steam rooms, or saunas. °· Wear your seat belt at all times when driving. °· Avoid raw meat, uncooked cheese, cat litter boxes, and soil used by cats. These carry germs that can cause birth defects in the baby. °· Take your prenatal vitamins. °· Take 1500-2000 mg of calcium daily starting at the 20th week of pregnancy until you deliver your baby. °· Try taking a stool softener (if your caregiver approves) if you develop constipation. Eat more high-fiber foods, such as fresh vegetables or fruit and whole grains. Drink plenty of fluids to keep your urine clear or pale yellow. °· Take warm sitz baths to soothe any pain or discomfort caused by hemorrhoids. Use hemorrhoid cream if your caregiver approves. °· If you develop varicose veins, wear support hose. Elevate your feet for 15 minutes, 3-4 times a day. Limit salt in your diet. °· Avoid heavy lifting, wear low heal shoes, and practice good posture. °· Rest a lot with your legs elevated if you have leg cramps or low back pain. °· Visit your dentist if you have not gone during your pregnancy. Use a soft toothbrush to brush your teeth and be gentle when you floss. °· A sexual relationship may be continued unless your caregiver directs you  otherwise. °· Do not travel far distances unless it is absolutely necessary and only with the approval of your caregiver. °· Take prenatal classes to understand, practice, and ask questions about the labor and delivery. °· Make a trial run to the hospital. °· Pack your hospital bag. °· Prepare the baby's nursery. °· Continue to go to all your prenatal visits as directed by your caregiver. °SEEK MEDICAL CARE IF: °· You are unsure if you are in labor or if your water has broken. °· You have dizziness. °· You have mild pelvic cramps, pelvic pressure, or nagging pain in your abdominal area. °· You have persistent nausea, vomiting, or diarrhea. °· You have a bad smelling vaginal discharge. °· You have pain with urination. °SEEK IMMEDIATE MEDICAL CARE IF:  °· You have a fever. °· You are leaking fluid from your vagina. °· You have spotting or bleeding from your vagina. °· You have severe abdominal cramping or pain. °· You have rapid weight loss or gain. °· You have shortness of breath with chest pain. °· You notice sudden or extreme swelling of your face, hands, ankles, feet, or legs. °· You have not felt your baby move in over an hour. °· You have severe headaches that do not go away with medicine. °· You have vision changes. °  °This information is not intended to replace advice given to you by your health care provider. Make sure you discuss any questions you have with your health care provider. °  °Document Released: 02/26/2001 Document Revised: 03/25/2014 Document Reviewed: 05/05/2012 °Elsevier Interactive Patient Education ©2016 [employer] ° °

## 2016-01-11 NOTE — MAU Note (Signed)
Pt sent over from MD office due to NRNST. Pt was here in MAU last night and had a NRNST and a BPP of 6/8. Pt was sent to office today for NST which was non-reactive. Pt states she felt the baby move a little this morning but no movement since 0930. Pt denies contractions, bleeding, and leaking of fluid.

## 2016-01-11 NOTE — MAU Provider Note (Signed)
Chief Complaint:  NRNST   First Provider Initiated Contact with Patient 01/11/16 1310     HPI: Dana Cole is a 35 y.o. [id number] at 59w0dho presents to maternity admissions reporting Decreased fetal movement.  BP noted to be elevated today while here and pt now reports feeling lightheaded today which is new to her. . She reports good fetal movement, denies LOF, vaginal bleeding, vaginal itching/burning, urinary symptoms, h/a, dizziness, n/v, diarrhea, constipation or fever/chills.  She denies headache, visual changes or RUQ abdominal pain.  Dizziness  This is a new problem. The current episode started today. The problem occurs constantly. The problem has been unchanged. Pertinent negatives include no abdominal pain, chills, fever, headaches, myalgias, nausea or visual change. Nothing aggravates the symptoms. She has tried nothing for the symptoms.   Seen here yesterday for decreased FM.  BPP 6/8, so was told to go to office for NST today which was nonreactive.  Past Medical History: Past Medical History:  Diagnosis Date  . Chlamydia   . Leukocytosis 08/07/2015  . Medical history non-contributory   . Trichomonas infection     Past obstetric history: OB History  Gravida Para Term Preterm AB Living  6 4 3 1 1 4   SAB TAB Ectopic Multiple Live Births  1       4    # Outcome Date GA Lbr Len/2nd Weight Sex Delivery Anes PTL Lv  6 Current           5 Term 09/07/07   6 lb 5 oz (2.863 kg) M Vag-Spont None N LIV  4 Term 08/18/02   5 lb 5 oz (2.41 kg) F Vag-Spont EPI N LIV  3 Preterm 09/15/99   6 lb 8 oz (2.948 kg) M Vag-Spont EPI N LIV  2 Term 01/14/98   7 lb 14 oz (3.572 kg) F Vag-Spont Local, Gen N LIV  1 SAB             Obstetric Comments  Child #3 was 5 weeks preterm.     Past Surgical History: Past Surgical History:  Procedure Laterality Date  . HIP SURGERY    . JOINT REPLACEMENT    . TUBAL LIGATION      Family History: Family History  Problem Relation Age of Onset  .  Hypertension Other   . Diabetes Other     Social History: Social History  Substance Use Topics  . Smoking status: Former Smoker    Types: Cigarettes    Quit date: 06/07/2015  . Smokeless tobacco: Never Used  . Alcohol use Yes     Comment: occassional     Allergies:  Allergies  Allergen Reactions  . Shellfish Allergy Itching and Swelling    Meds:  Prescriptions Prior to Admission  Medication Sig Dispense Refill Last Dose  . acetaminophen (TYLENOL) 325 MG tablet Take 650 mg by mouth every 6 (six) hours as needed for moderate pain.   Past Month at Unknown time  . calcium carbonate (TUMS - DOSED IN MG ELEMENTAL CALCIUM) 500 MG chewable tablet Chew 3-4 tablets by mouth 4 (four) times daily as needed for indigestion or heartburn.   01/09/2016 at Unknown time  . Prenat-FeAsp-Meth-FA-DHA w/o A (PRENATE PIXIE) 10-0.6-0.4-200 MG CAPS Take 1 tablet by mouth daily. 30 capsule 12 01/09/2016 at Unknown time  . ranitidine (ZANTAC) 150 MG capsule Take 1 capsule (150 mg total) by mouth 2 (two) times daily as needed for heartburn. 60 capsule 2 Past Week at Unknown time  I have reviewed patient's Past Medical Hx, Surgical Hx, Family Hx, Social Hx, medications and allergies.   ROS:  Review of Systems  Constitutional: Negative for chills and fever.  Gastrointestinal: Negative for abdominal pain and nausea.  Musculoskeletal: Negative for myalgias.  Neurological: Positive for dizziness. Negative for headaches.   Other systems negative  Physical Exam  Patient Vitals for the past 24 hrs:  BP Temp Temp src Pulse Resp SpO2 Height Weight  01/11/16 1323 113/61 - - 111 - 98 % - -  01/11/16 1322 - - - 98 - 99 % - -  01/11/16 1312 - - - 109 - 97 % - -  01/11/16 1307 - - - 108 - 99 % - -  01/11/16 1306 (!) 147/117 - - 104 18 - - -  01/11/16 1258 - - - - - 98 % 5' 9"  (1.753 m) 280 lb (127 kg)  01/11/16 1255 (!) 152/105 98.4 F (36.9 C) Oral (!) 121 18 - - -   Vitals:   01/11/16 1428 01/11/16  1435 01/11/16 1436 01/11/16 1441  BP:  108/65    Pulse: 90 87 81 88  Resp:      Temp:      TempSrc:      SpO2: 99%  98% 99%  Weight:      Height:        Constitutional: Well-developed, well-nourished female in no acute distress.  Cardiovascular: normal rate and rhythm Respiratory: normal effort, clear to auscultation bilaterally GI: Abd soft, non-tender, gravid appropriate for gestational age.   No rebound or guarding. MS: Extremities nontender, trace edema, normal ROM Neurologic: Alert and oriented x 4. DTRs 2+/ no clonus GU: Neg CVAT.  FHT:  Baseline 140 , moderate variability, accelerations present, no decelerations Contractions:  Rare   Labs: Results for orders placed or performed during the hospital encounter of 01/11/16 (from the past 72 hour(s))  Protein / creatinine ratio, urine     Status: Abnormal   Collection Time: 01/11/16  1:15 PM  Result Value Ref Range   Creatinine, Urine 93.00 mg/dL   Total Protein, Urine 19 mg/dL    Comment: NO NORMAL RANGE ESTABLISHED FOR THIS TEST   Protein Creatinine Ratio 0.20 (H) 0.00 - 0.15 mg/mg[Cre]  CBC     Status: Abnormal   Collection Time: 01/11/16  1:36 PM  Result Value Ref Range   WBC 8.5 4.0 - 10.5 K/uL   RBC 3.73 (L) 3.87 - 5.11 MIL/uL   Hemoglobin 11.4 (L) 12.0 - 15.0 g/dL   HCT 32.2 (L) 36.0 - 46.0 %   MCV 86.3 78.0 - 100.0 fL   MCH 30.6 26.0 - 34.0 pg   MCHC 35.4 30.0 - 36.0 g/dL   RDW 13.3 11.5 - 15.5 %   Platelets 204 150 - 400 K/uL  Comprehensive metabolic panel     Status: Abnormal   Collection Time: 01/11/16  1:36 PM  Result Value Ref Range   Sodium 136 135 - 145 mmol/L   Potassium 3.2 (L) 3.5 - 5.1 mmol/L   Chloride 106 101 - 111 mmol/L   CO2 23 22 - 32 mmol/L   Glucose, Bld 109 (H) 65 - 99 mg/dL   BUN 8 6 - 20 mg/dL   Creatinine, Ser 0.50 0.44 - 1.00 mg/dL   Calcium 8.6 (L) 8.9 - 10.3 mg/dL   Total Protein 7.0 6.5 - 8.1 g/dL   Albumin 3.1 (L) 3.5 - 5.0 g/dL   AST 17 15 - 41  U/L   ALT 16 14 - 54 U/L    Alkaline Phosphatase 56 38 - 126 U/L   Total Bilirubin 0.9 0.3 - 1.2 mg/dL   GFR calc non Af Amer >60 >60 mL/min   GFR calc Af Amer >60 >60 mL/min    Comment: (NOTE) The eGFR has been calculated using the CKD EPI equation. This calculation has not been validated in all clinical situations. eGFR's persistently <60 mL/min signify possible Chronic Kidney Disease.    Anion gap 7 5 - 15    Imaging:   MAU Course/MDM: I have ordered labs and reviewed results. With new hypertension, will run [facility] labs  >> these were all normal and her last 2 BPs are excellent. Unsure if two high values were true elevations.  NST reviewed Consult Dr Rip Harbour with presentation, exam findings and test results. NST is reactive. BPs now normal and labs are normal May discharge home. Will recheck in office Treatments in MAU included PIH eval and NST.    Assessment: SIUP at 52w0dDecreased fetal movement, now feeling movement Reactive NST, category I Labile hypertension vs spurious values Negative PIH labs  Plan: Discharge home Preterm Labor precautions and fetal kick counts Follow up in Office for prenatal visits and recheck of Bp this week  Encouraged to return here or to other Urgent Care/ED if she develops worsening of symptoms, increase in pain, fever, or other concerning symptoms.   Pt stable at time of discharge.  MHansel FeinsteinCNM, MSN Certified Nurse-Midwife 01/11/2016 1:35 PM

## 2016-01-14 ENCOUNTER — Telehealth (HOSPITAL_COMMUNITY): Payer: Self-pay | Admitting: Advanced Practice Midwife

## 2016-01-14 NOTE — Telephone Encounter (Signed)
Patient returned call I notified her of positive Gonorrhea She wants to call [facility] to see if she can be treated there If not [facility]

## 2016-01-14 NOTE — Telephone Encounter (Signed)
Attempted to contact pt regarding + gonorrhea Left message to call back [facility]

## 2016-01-15 ENCOUNTER — Telehealth: Payer: Self-pay

## 2016-01-15 ENCOUNTER — Other Ambulatory Visit: Payer: Self-pay | Admitting: Certified Nurse Midwife

## 2016-01-15 DIAGNOSIS — A549 Gonococcal infection, unspecified: Secondary | ICD-10-CM

## 2016-01-15 DIAGNOSIS — O98213 Gonorrhea complicating pregnancy, third trimester: Secondary | ICD-10-CM

## 2016-01-15 MED ORDER — AZITHROMYCIN 250 MG PO TABS: ORAL_TABLET | ORAL | 0 refills | Status: DC

## 2016-01-15 NOTE — Progress Notes (Unsigned)
To come to the office for a shot of Rocephin.

## 2016-01-15 NOTE — Telephone Encounter (Signed)
Returned patient call and patient made aware of + Gonorrhea, RX sent to pharmacy and patient made aware [facility], pt. Verbalized understanding.

## 2016-01-17 ENCOUNTER — Ambulatory Visit (INDEPENDENT_AMBULATORY_CARE_PROVIDER_SITE_OTHER): Payer: 59 | Admitting: Obstetrics

## 2016-01-17 ENCOUNTER — Encounter: Payer: Self-pay | Admitting: Obstetrics

## 2016-01-17 VITALS — BP 129/84 | HR 92 | Temp 98.4°F | Wt 282.9 lb

## 2016-01-17 DIAGNOSIS — O09523 Supervision of elderly multigravida, third trimester: Secondary | ICD-10-CM

## 2016-01-17 DIAGNOSIS — O0993 Supervision of high risk pregnancy, unspecified, third trimester: Secondary | ICD-10-CM

## 2016-01-17 NOTE — Progress Notes (Signed)
Subjective:    Dana Cole is a 35 y.o. female being seen today for her obstetrical visit. She is at 5483w6d gestation. Patient reports no complaints. Fetal movement: normal.  Problem List Items Addressed This Visit    None    Visit Diagnoses    Supervision of high risk pregnancy in third trimester    -  Primary   Advanced maternal age in multigravida, third trimester         Patient Active Problem List   Diagnosis Date Noted  . Gonorrhea affecting pregnancy in third trimester 01/15/2016  . Supervision of high risk pregnancy in second trimester 10/31/2015  . AMA (advanced maternal age) multigravida 35+ 09/06/2015  . Late prenatal care affecting pregnancy in second trimester, antepartum 09/06/2015  . Surgical history of tubal ligation 09/06/2015  . Leukocytosis 08/07/2015   Objective:    BP 129/84   Pulse 92   Temp 98.4 F (36.9 C)   Wt 282 lb 14.4 oz (128.3 kg)   LMP 05/22/2015 (LMP Unknown)   BMI 41.78 kg/m  FHT:  150 BPM  Uterine Size: size equals dates  Presentation: unsure     Assessment:    Pregnancy @ 3483w6d weeks   Plan:     labs reviewed, problem list updated Consent signed. GBS sent TDAP offered  Rhogam given for RH negative Pediatrician: discussed. Infant feeding: plans to breastfeed. Maternity leave: discussed. Cigarette smoking: former smoker. No orders of the defined types were placed in this encounter.  No orders of the defined types were placed in this encounter.  Follow up in 1 Week.

## 2016-01-25 ENCOUNTER — Inpatient Hospital Stay (HOSPITAL_COMMUNITY): Payer: Medicaid Other

## 2016-01-25 ENCOUNTER — Inpatient Hospital Stay (HOSPITAL_COMMUNITY)
Admission: AD | Admit: 2016-01-25 | Discharge: 2016-01-25 | Disposition: A | Discharge status: Home / Self Care | Payer: Medicaid Other | Source: Ambulatory Visit | Attending: Obstetrics & Gynecology | Admitting: Obstetrics & Gynecology

## 2016-01-25 ENCOUNTER — Encounter (HOSPITAL_COMMUNITY): Payer: Self-pay | Admitting: *Deleted

## 2016-01-25 ENCOUNTER — Ambulatory Visit (INDEPENDENT_AMBULATORY_CARE_PROVIDER_SITE_OTHER): Payer: 59 | Admitting: Certified Nurse Midwife

## 2016-01-25 VITALS — BP 117/81 | HR 101 | Wt 286.0 lb

## 2016-01-25 DIAGNOSIS — O283 Abnormal ultrasonic finding on antenatal screening of mother: Secondary | ICD-10-CM | POA: Insufficient documentation

## 2016-01-25 DIAGNOSIS — O09523 Supervision of elderly multigravida, third trimester: Secondary | ICD-10-CM | POA: Insufficient documentation

## 2016-01-25 DIAGNOSIS — O99213 Obesity complicating pregnancy, third trimester: Secondary | ICD-10-CM | POA: Insufficient documentation

## 2016-01-25 DIAGNOSIS — N920 Excessive and frequent menstruation with regular cycle: Secondary | ICD-10-CM | POA: Diagnosis not present

## 2016-01-25 DIAGNOSIS — Z3A36 36 weeks gestation of pregnancy: Secondary | ICD-10-CM

## 2016-01-25 DIAGNOSIS — O09529 Supervision of elderly multigravida, unspecified trimester: Secondary | ICD-10-CM

## 2016-01-25 DIAGNOSIS — O288 Other abnormal findings on antenatal screening of mother: Secondary | ICD-10-CM

## 2016-01-25 DIAGNOSIS — O0993 Supervision of high risk pregnancy, unspecified, third trimester: Secondary | ICD-10-CM

## 2016-01-25 DIAGNOSIS — O368130 Decreased fetal movements, third trimester, not applicable or unspecified: Secondary | ICD-10-CM

## 2016-01-25 DIAGNOSIS — Z87891 Personal history of nicotine dependence: Secondary | ICD-10-CM | POA: Diagnosis not present

## 2016-01-25 HISTORY — DX: Obesity, unspecified: E66.9

## 2016-01-25 LAB — URINALYSIS, ROUTINE W REFLEX MICROSCOPIC
BILIRUBIN URINE: NEGATIVE
Glucose, UA: NEGATIVE mg/dL
KETONES UR: NEGATIVE mg/dL
NITRITE: NEGATIVE
PH: 6 (ref 5.0–8.0)
PROTEIN: NEGATIVE mg/dL
Specific Gravity, Urine: 1.015 (ref 1.005–1.030)

## 2016-01-25 LAB — URINE MICROSCOPIC-ADD ON

## 2016-01-25 NOTE — Discharge Instructions (Signed)
Please follow up with your OB on Monday Return if you feel decreased fetal movement that is abnormal to you.  Fetal Movement Counts Patient Name: __________________________________________________ Patient Due Date: ____________________ Performing a fetal movement count is highly recommended in high-risk pregnancies, but it is good for every pregnant woman to do. Your health care provider may ask you to start counting fetal movements at 28 weeks of the pregnancy. Fetal movements often increase:  After eating a full meal.  After physical activity.  After eating or drinking something sweet or cold.  At rest. Pay attention to when you feel the baby is most active. This will help you notice a pattern of your baby's sleep and wake cycles and what factors contribute to an increase in fetal movement. It is important to perform a fetal movement count at the same time each day when your baby is normally most active.  HOW TO COUNT FETAL MOVEMENTS 1. Find a quiet and comfortable area to sit or lie down on your left side. Lying on your left side provides the best blood and oxygen circulation to your baby. 2. Write down the day and time on a sheet of paper or in a journal. 3. Start counting kicks, flutters, swishes, rolls, or jabs in a 2-hour period. You should feel at least 10 movements within 2 hours. 4. If you do not feel 10 movements in 2 hours, wait 2-3 hours and count again. Look for a change in the pattern or not enough counts in 2 hours. SEEK MEDICAL CARE IF:  You feel less than 10 counts in 2 hours, tried twice.  There is no movement in over an hour.  The pattern is changing or taking longer each day to reach 10 counts in 2 hours.  You feel the baby is not moving as he or she usually does. Date: ____________ Movements: ____________ Start time: ____________ Doreatha MartinFinish time: ____________  Date: ____________ Movements: ____________ Start time: ____________ Doreatha MartinFinish time: ____________ Date:  ____________ Movements: ____________ Start time: ____________ Doreatha MartinFinish time: ____________ Date: ____________ Movements: ____________ Start time: ____________ Doreatha MartinFinish time: ____________ Date: ____________ Movements: ____________ Start time: ____________ Doreatha MartinFinish time: ____________ Date: ____________ Movements: ____________ Start time: ____________ Doreatha MartinFinish time: ____________ Date: ____________ Movements: ____________ Start time: ____________ Doreatha MartinFinish time: ____________ Date: ____________ Movements: ____________ Start time: ____________ Doreatha MartinFinish time: ____________  Date: ____________ Movements: ____________ Start time: ____________ Doreatha MartinFinish time: ____________ Date: ____________ Movements: ____________ Start time: ____________ Doreatha MartinFinish time: ____________ Date: ____________ Movements: ____________ Start time: ____________ Doreatha MartinFinish time: ____________ Date: ____________ Movements: ____________ Start time: ____________ Doreatha MartinFinish time: ____________ Date: ____________ Movements: ____________ Start time: ____________ Doreatha MartinFinish time: ____________ Date: ____________ Movements: ____________ Start time: ____________ Doreatha MartinFinish time: ____________ Date: ____________ Movements: ____________ Start time: ____________ Doreatha MartinFinish time: ____________  Date: ____________ Movements: ____________ Start time: ____________ Doreatha MartinFinish time: ____________ Date: ____________ Movements: ____________ Start time: ____________ Doreatha MartinFinish time: ____________ Date: ____________ Movements: ____________ Start time: ____________ Doreatha MartinFinish time: ____________ Date: ____________ Movements: ____________ Start time: ____________ Doreatha MartinFinish time: ____________ Date: ____________ Movements: ____________ Start time: ____________ Doreatha MartinFinish time: ____________ Date: ____________ Movements: ____________ Start time: ____________ Doreatha MartinFinish time: ____________ Date: ____________ Movements: ____________ Start time: ____________ Doreatha MartinFinish time: ____________  Date: ____________ Movements: ____________ Start  time: ____________ Doreatha MartinFinish time: ____________ Date: ____________ Movements: ____________ Start time: ____________ Doreatha MartinFinish time: ____________ Date: ____________ Movements: ____________ Start time: ____________ Doreatha MartinFinish time: ____________ Date: ____________ Movements: ____________ Start time: ____________ Doreatha MartinFinish time: ____________ Date: ____________ Movements: ____________ Start time: ____________ Doreatha MartinFinish time: ____________ Date: ____________ Movements: ____________ Start time: ____________ Doreatha MartinFinish  time: ____________ Date: ____________ Movements: ____________ Start time: ____________ Doreatha MartinFinish time: ____________  Date: ____________ Movements: ____________ Start time: ____________ Doreatha MartinFinish time: ____________ Date: ____________ Movements: ____________ Start time: ____________ Doreatha MartinFinish time: ____________ Date: ____________ Movements: ____________ Start time: ____________ Doreatha MartinFinish time: ____________ Date: ____________ Movements: ____________ Start time: ____________ Doreatha MartinFinish time: ____________ Date: ____________ Movements: ____________ Start time: ____________ Doreatha MartinFinish time: ____________ Date: ____________ Movements: ____________ Start time: ____________ Doreatha MartinFinish time: ____________ Date: ____________ Movements: ____________ Start time: ____________ Doreatha MartinFinish time: ____________  Date: ____________ Movements: ____________ Start time: ____________ Doreatha MartinFinish time: ____________ Date: ____________ Movements: ____________ Start time: ____________ Doreatha MartinFinish time: ____________ Date: ____________ Movements: ____________ Start time: ____________ Doreatha MartinFinish time: ____________ Date: ____________ Movements: ____________ Start time: ____________ Doreatha MartinFinish time: ____________ Date: ____________ Movements: ____________ Start time: ____________ Doreatha MartinFinish time: ____________ Date: ____________ Movements: ____________ Start time: ____________ Doreatha MartinFinish time: ____________ Date: ____________ Movements: ____________ Start time: ____________ Doreatha MartinFinish time: ____________    Date: ____________ Movements: ____________ Start time: ____________ Doreatha MartinFinish time: ____________ Date: ____________ Movements: ____________ Start time: ____________ Doreatha MartinFinish time: ____________ Date: ____________ Movements: ____________ Start time: ____________ Doreatha MartinFinish time: ____________ Date: ____________ Movements: ____________ Start time: ____________ Doreatha MartinFinish time: ____________ Date: ____________ Movements: ____________ Start time: ____________ Doreatha MartinFinish time: ____________ Date: ____________ Movements: ____________ Start time: ____________ Doreatha MartinFinish time: ____________ Date: ____________ Movements: ____________ Start time: ____________ Doreatha MartinFinish time: ____________  Date: ____________ Movements: ____________ Start time: ____________ Doreatha MartinFinish time: ____________ Date: ____________ Movements: ____________ Start time: ____________ Doreatha MartinFinish time: ____________ Date: ____________ Movements: ____________ Start time: ____________ Doreatha MartinFinish time: ____________ Date: ____________ Movements: ____________ Start time: ____________ Doreatha MartinFinish time: ____________ Date: ____________ Movements: ____________ Start time: ____________ Doreatha MartinFinish time: ____________ Date: ____________ Movements: ____________ Start time: ____________ Doreatha MartinFinish time: ____________   This information is not intended to replace advice given to you by your health care provider. Make sure you discuss any questions you have with your health care provider.   Document Released: 04/03/2006 Document Revised: 03/25/2014 Document Reviewed: 12/30/2011 Elsevier Interactive Patient Education [employer]2016 Elsevier Inc.

## 2016-01-25 NOTE — MAU Provider Note (Signed)
MAU HISTORY AND PHYSICAL  Chief Complaint:  Non-stress Test   Dana Cole is a 35 y.o.  [id number]  at 4730w0d presenting for Non-stress Test . [facility].  Patient states she has been having  none contractions, none vaginal bleeding, intact membranes, with normal for her amount of fetal movement.    Was seen in office today and had a non-reactive NST with minimal variability and one variable noted.   She reports not feeling much fetal movement normally and was not concerned today. Feels well. This pregnancy was unplanned because she has had a prior BTL that failed. She is planning repeat BTL.  No other concerns at this time.  Past Medical History:  Diagnosis Date  . Chlamydia   . Leukocytosis 08/07/2015  . Medical history non-contributory   . Obesity   . Trichomonas infection     Past Surgical History:  Procedure Laterality Date  . HIP SURGERY    . JOINT REPLACEMENT    . TUBAL LIGATION      Family History  Problem Relation Age of Onset  . Hypertension Other   . Diabetes Other     Social History  Substance Use Topics  . Smoking status: Former Smoker    Types: Cigarettes    Quit date: 06/07/2015  . Smokeless tobacco: Former NeurosurgeonUser  . Alcohol use Yes     Comment: occassional     Allergies  Allergen Reactions  . Shellfish Allergy Itching and Swelling    Prescriptions Prior to Admission  Medication Sig Dispense Refill Last Dose  . acetaminophen (TYLENOL) 325 MG tablet Take 650 mg by mouth every 6 (six) hours as needed for moderate pain.   Past Month at Unknown time  . calcium carbonate (TUMS - DOSED IN MG ELEMENTAL CALCIUM) 500 MG chewable tablet Chew 3-4 tablets by mouth 4 (four) times daily as needed for indigestion or heartburn.   Past Week at Unknown time  . Prenat-FeAsp-Meth-FA-DHA w/o A (PRENATE PIXIE) 10-0.6-0.4-200 MG CAPS Take 1 tablet by mouth daily. 30 capsule 12 01/24/2016 at Unknown time  . ranitidine (ZANTAC) 150 MG  capsule Take 1 capsule (150 mg total) by mouth 2 (two) times daily as needed for heartburn. 60 capsule 2 01/24/2016 at Unknown time  . azithromycin (ZITHROMAX) 250 MG tablet Take 4 tablets all together now. (Patient not taking: Reported on 01/25/2016) 4 tablet 0 Not Taking at Unknown time    Review of Systems - Negative except for what is mentioned in HPI.  Physical Exam  Last menstrual period 05/22/2015. GENERAL: Well-developed, well-nourished female in no acute distress.  LUNGS: No respiratory distress HEART: Regular rate ABDOMEN: Soft, nontender, nondistended, gravid abdomen EXTREMITIES: Nontender, no edema, 2+ distal pulses. Presentation: cephalic FHT:  Baseline 150, moderate variability, accelerations present, One variable noted Contractions: none   Labs: Results for orders placed or performed during the hospital encounter of 01/25/16 (from the past 24 hour(s))  Urinalysis, Routine w reflex microscopic ([facility])   Collection Time: 01/25/16  3:50 PM  Result Value Ref Range   Color, Urine YELLOW YELLOW   APPearance CLEAR CLEAR   Specific Gravity, Urine 1.015 1.005 - 1.030   pH 6.0 5.0 - 8.0   Glucose, UA NEGATIVE NEGATIVE mg/dL   Hgb urine dipstick TRACE (A) NEGATIVE   Bilirubin Urine NEGATIVE NEGATIVE   Ketones, ur NEGATIVE NEGATIVE mg/dL   Protein, ur NEGATIVE NEGATIVE mg/dL   Nitrite NEGATIVE NEGATIVE   Leukocytes, UA TRACE (  A) NEGATIVE  Urine microscopic-add on   Collection Time: 01/25/16  3:50 PM  Result Value Ref Range   Squamous Epithelial / LPF 6-30 (A) NONE SEEN   WBC, UA 6-30 0 - 5 WBC/hpf   RBC / HPF 6-30 0 - 5 RBC/hpf   Bacteria, UA FEW (A) NONE SEEN    Imaging Studies:  MFM BPP with preliminary report 8/8  Assessment: Dana Popperomekia N Akers is  35 y.o. [id number] at 5359w0d presents with Non-stress Test . MDM UA NST BPP  Plan:   Reactive NST BPP 8/8 Discharge home with return precautions Follow up in office on Monday Patient verbalized agreement and  understanding with plan  Tillman SersAngela C Riccio, DO PGY-1 11/9/20176:39 PM  Midwife attestation:  I have seen and examined this patient; I agree with above documentation in the resident's note.   Dana Cole is a 35 y.o. [id number] sent from the office for decreased FM and nonreactive NST. Pt reports fetal movement is normal for her, as she does not feel this baby move very much. She does report fetal movement while in MAU.  Denies LOF, VB, contractions, vaginal discharge.  PE: BP 121/59   Pulse 83   Resp 18   LMP 05/22/2015 (LMP Unknown)  Gen: calm comfortable, NAD Resp: normal effort, no distress Abd: gravid  ROS, labs, PMH reviewed FHR tracing with accels, and one isolated variable Consult Dr Debroah LoopArnold to review FHR tracing  BPP with 8/8 today  A/P: Follow up in office on Monday Labor precautions/fetal kick counts reviewed  LEFTWICH-KIRBY, LISA, CNM  7:25 PM

## 2016-01-25 NOTE — Progress Notes (Signed)
Pt states recent treatment for +GC at health dept, last Tuesday.

## 2016-01-25 NOTE — MAU Note (Signed)
Pt sent in for further evaluation; had a nonreative NST at her OB's office;

## 2016-01-25 NOTE — Progress Notes (Signed)
Subjective:    Dana Cole is a 35 y.o. female being seen today for her obstetrical visit. She is at 8746w0d gestation. Patient reports no complaints. Fetal movement: decreased.  Problem List Items Addressed This Visit    None     Patient Active Problem List   Diagnosis Date Noted  . Gonorrhea affecting pregnancy in third trimester 01/15/2016  . Supervision of high risk pregnancy in second trimester 10/31/2015  . AMA (advanced maternal age) multigravida 35+ 09/06/2015  . Late prenatal care affecting pregnancy in second trimester, antepartum 09/06/2015  . Surgical history of tubal ligation 09/06/2015  . Leukocytosis 08/07/2015   Objective:    BP 117/81   Pulse (!) 101   Wt 286 lb (129.7 kg)   LMP 05/22/2015 (LMP Unknown)   BMI 42.23 kg/m  FHT:  150 BPM  Uterine Size: 36 cm and size equals dates  Presentation: unsure   NST: minimal variability <5 BPM, 1 variable, no contractions  Cervix: 1cm, long, anterior, -3 station  Assessment:    Pregnancy @ 6846w0d weeks   non-reactive  NST Recent gonorrhea: s/p Rocephin/azithromycin  Plan:     To MAU for Non-reactive NST   labs reviewed, problem list updated Consent signed. GBS sent; may not be accurate d/t recent antibiotic use TDAP offered  Rhogam given for RH negative Pediatrician: discussed. Infant feeding: plans to breastfeed. Maternity leave: discussed. Cigarette smoking: never smoked. No orders of the defined types were placed in this encounter.  No orders of the defined types were placed in this encounter.  Follow up in 1 Week.

## 2016-01-25 NOTE — Addendum Note (Signed)
Addended by: Marya LandryFOSTER, Shalay Carder D on: 01/25/2016 03:31 PM   Modules accepted: Orders

## 2016-01-26 ENCOUNTER — Inpatient Hospital Stay (HOSPITAL_COMMUNITY)
Admission: AD | Admit: 2016-01-26 | Discharge: 2016-01-26 | Disposition: A | Discharge status: Home / Self Care | Payer: Medicaid Other | Source: Ambulatory Visit | Attending: Obstetrics & Gynecology | Admitting: Obstetrics & Gynecology

## 2016-01-26 ENCOUNTER — Encounter (HOSPITAL_COMMUNITY): Payer: Self-pay | Admitting: Certified Nurse Midwife

## 2016-01-26 DIAGNOSIS — Z87891 Personal history of nicotine dependence: Secondary | ICD-10-CM | POA: Diagnosis not present

## 2016-01-26 DIAGNOSIS — Z3A36 36 weeks gestation of pregnancy: Secondary | ICD-10-CM | POA: Diagnosis not present

## 2016-01-26 DIAGNOSIS — O26853 Spotting complicating pregnancy, third trimester: Secondary | ICD-10-CM | POA: Insufficient documentation

## 2016-01-26 DIAGNOSIS — N920 Excessive and frequent menstruation with regular cycle: Secondary | ICD-10-CM

## 2016-01-26 DIAGNOSIS — R109 Unspecified abdominal pain: Secondary | ICD-10-CM | POA: Diagnosis present

## 2016-01-26 NOTE — Discharge Instructions (Signed)
Braxton Hicks Contractions °Contractions of the uterus can occur throughout pregnancy. Contractions are not always a sign that you are in labor.  °WHAT ARE BRAXTON HICKS CONTRACTIONS?  °Contractions that occur before labor are called Braxton Hicks contractions, or false labor. Toward the end of pregnancy (32-34 weeks), these contractions can develop more often and may become more forceful. This is not true labor because these contractions do not result in opening (dilatation) and thinning of the cervix. They are sometimes difficult to tell apart from true labor because these contractions can be forceful and people have different pain tolerances. You should not feel embarrassed if you go to the hospital with false labor. Sometimes, the only way to tell if you are in true labor is for your health care provider to look for changes in the cervix. °If there are no prenatal problems or other health problems associated with the pregnancy, it is completely safe to be sent home with false labor and await the onset of true labor. °HOW CAN YOU TELL THE DIFFERENCE BETWEEN TRUE AND FALSE LABOR? °False Labor °· The contractions of false labor are usually shorter and not as hard as those of true labor.   °· The contractions are usually irregular.   °· The contractions are often felt in the front of the lower abdomen and in the groin.   °· The contractions may go away when you walk around or change positions while lying down.   °· The contractions get weaker and are shorter lasting as time goes on.   °· The contractions do not usually become progressively stronger, regular, and closer together as with true labor.   °True Labor °· Contractions in true labor last 30-70 seconds, become very regular, usually become more intense, and increase in frequency.   °· The contractions do not go away with walking.   °· The discomfort is usually felt in the top of the uterus and spreads to the lower abdomen and low back.   °· True labor can be  determined by your health care provider with an exam. This will show that the cervix is dilating and getting thinner.   °WHAT TO REMEMBER °· Keep up with your usual exercises and follow other instructions given by your health care provider.   °· Take medicines as directed by your health care provider.   °· Keep your regular prenatal appointments.   °· Eat and drink lightly if you think you are going into labor.   °· If Braxton Hicks contractions are making you uncomfortable:   °¨ Change your position from lying down or resting to walking, or from walking to resting.   °¨ Sit and rest in a tub of warm water.   °¨ Drink 2-3 glasses of water. Dehydration may cause these contractions.   °¨ Do slow and deep breathing several times an hour.   °WHEN SHOULD I SEEK IMMEDIATE MEDICAL CARE? °Seek immediate medical care if: °· Your contractions become stronger, more regular, and closer together.   °· You have fluid leaking or gushing from your vagina.   °· You have a fever.   °· You pass blood-tinged mucus.   °· You have vaginal bleeding.   °· You have continuous abdominal pain.   °· You have low back pain that you never had before.   °· You feel your baby's head pushing down and causing pelvic pressure.   °· Your baby is not moving as much as it used to.   °  °This information is not intended to replace advice given to you by your health care provider. Make sure you discuss any questions you have with your health care   provider. °  °Document Released: 03/04/2005 Document Revised: 03/09/2013 Document Reviewed: 12/14/2012 °Elsevier Interactive Patient Education ©2016 Elsevier Inc. ° °

## 2016-01-26 NOTE — MAU Note (Signed)
Pt states she thinks her mucous plug came out. Pt has had some spotting when she wipes. Pt feels cramping. Fetus is active. Pt denies LOF.

## 2016-01-26 NOTE — MAU Provider Note (Signed)
None     Chief Complaint:  Abdominal Cramping and Vaginal Bleeding   Dana Cole is  35 y.o. [id number] at 8117w1d presents complaining of Abdominal Cramping and Vaginal Bleeding  She was cramping earlier today, none since being in MAU. Has "constant" LBP since yesterday. Noticed some spotting when wiping, along with some mucous.  Denies recent intercourse, pain/itching/irritaiton/odor.  Had BPP yesterday (8/8) d/t NR NST.  Baby is active.   Obstetrical/Gynecological History: OB History    Gravida Para Term Preterm AB Living   6 4 3 1 1 4    SAB TAB Ectopic Multiple Live Births   1       4      Obstetric Comments   Child #3 was 5 weeks preterm.      Past Medical History: Past Medical History:  Diagnosis Date  . Chlamydia   . Leukocytosis 08/07/2015  . Medical history non-contributory   . Obesity   . Trichomonas infection     Past Surgical History: Past Surgical History:  Procedure Laterality Date  . HIP SURGERY    . JOINT REPLACEMENT    . TUBAL LIGATION      Family History: Family History  Problem Relation Age of Onset  . Hypertension Other   . Diabetes Other     Social History: Social History  Substance Use Topics  . Smoking status: Former Smoker    Types: Cigarettes    Quit date: 06/07/2015  . Smokeless tobacco: Former NeurosurgeonUser  . Alcohol use No     Comment: occassional     Allergies:  Allergies  Allergen Reactions  . Shellfish Allergy Itching and Swelling    Meds:  Prescriptions Prior to Admission  Medication Sig Dispense Refill Last Dose  . acetaminophen (TYLENOL) 325 MG tablet Take 650 mg by mouth every 6 (six) hours as needed for moderate pain.   Past Month at Unknown time  . calcium carbonate (TUMS - DOSED IN MG ELEMENTAL CALCIUM) 500 MG chewable tablet Chew 3-4 tablets by mouth 4 (four) times daily as needed for indigestion or heartburn.   Past Week at Unknown time  . Prenat-FeAsp-Meth-FA-DHA w/o A (PRENATE PIXIE) 10-0.6-0.4-200 MG CAPS Take 1  tablet by mouth daily. 30 capsule 12 01/24/2016 at Unknown time  . ranitidine (ZANTAC) 150 MG capsule Take 1 capsule (150 mg total) by mouth 2 (two) times daily as needed for heartburn. 60 capsule 2 01/24/2016 at Unknown time    Review of Systems   Constitutional: Negative for fever and chills Eyes: Negative for visual disturbances Respiratory: Negative for shortness of breath, dyspnea Cardiovascular: Negative for chest pain or palpitations  Gastrointestinal: Negative for vomiting, diarrhea and constipation Genitourinary: Negative for dysuria and urgency Musculoskeletal: Negative for  joint pain, myalgias.  Normal ROM  Neurological: Negative for dizziness and headaches    Physical Exam  Blood pressure 137/78, pulse 79, temperature 98.4 F (36.9 C), temperature source Oral, resp. rate 18, last menstrual period 05/22/2015. GENERAL: Well-developed, well-nourished female in no acute distress.  LUNGS: Clear to auscultation bilaterally.  HEART: Regular rate and rhythm. ABDOMEN: Soft, nontender, nondistended, gravid.  EXTREMITIES: Nontender, no edema, 2+ distal pulses. DTR's 2+ PELVIC:  SSE:  Normal appearing discharge, no odor.  Some mucous with a speck of dark blood in it. Cx non friable.  CERVICAL EXAM: Dilatation 3cm   Effacement 50%   Station -2   Presentation: cephalic FHT:  Baseline rate 145 bpm   Variability moderate  Accelerations present  (10X10) Decelerations  none Contractions: Every 0 mins    Assessment: Dana Cole is  35 y.o. [id number] at 3237w1d presents with cervical mucous/spotting/no s/s of infection.  Plan: DC home.  F/U if ctx return.    CRESENZO-DISHMAN,Kieren Ricci 11/10/20177:20 PM

## 2016-01-27 LAB — STREP GP B NAA: Strep Gp B NAA: POSITIVE — AB

## 2016-01-29 ENCOUNTER — Other Ambulatory Visit: Payer: Self-pay | Admitting: Certified Nurse Midwife

## 2016-01-29 ENCOUNTER — Encounter (HOSPITAL_COMMUNITY): Payer: Self-pay | Admitting: *Deleted

## 2016-01-29 ENCOUNTER — Ambulatory Visit (INDEPENDENT_AMBULATORY_CARE_PROVIDER_SITE_OTHER): Payer: 59 | Admitting: Certified Nurse Midwife

## 2016-01-29 ENCOUNTER — Inpatient Hospital Stay (HOSPITAL_COMMUNITY)
Admission: AD | Admit: 2016-01-29 | Discharge: 2016-01-29 | Disposition: A | Discharge status: Home / Self Care | Payer: Medicaid Other | Source: Ambulatory Visit | Attending: Family Medicine | Admitting: Family Medicine

## 2016-01-29 VITALS — BP 137/87 | HR 92 | Wt 288.0 lb

## 2016-01-29 DIAGNOSIS — Z3A36 36 weeks gestation of pregnancy: Secondary | ICD-10-CM | POA: Insufficient documentation

## 2016-01-29 DIAGNOSIS — Z87891 Personal history of nicotine dependence: Secondary | ICD-10-CM | POA: Diagnosis not present

## 2016-01-29 DIAGNOSIS — B951 Streptococcus, group B, as the cause of diseases classified elsewhere: Secondary | ICD-10-CM | POA: Insufficient documentation

## 2016-01-29 DIAGNOSIS — Z3689 Encounter for other specified antenatal screening: Secondary | ICD-10-CM

## 2016-01-29 DIAGNOSIS — O09523 Supervision of elderly multigravida, third trimester: Secondary | ICD-10-CM

## 2016-01-29 NOTE — MAU Provider Note (Signed)
History     CSN: [id number]  Arrival date and time: 01/29/16 1151   First Provider Initiated Contact with Patient 01/29/16 1246      Chief Complaint  Patient presents with  . non reactive NST   HPI Dana Cole is a 35 y.o. [id number] at 6637w4d who presents sent from office for non reactive NST. Per provider note, pt had good variabiliy & no decels, but did not have accelerations.  Pt reports positive fetal movement. Has occasional contractions; no change for the last week.   OB History    Gravida Para Term Preterm AB Living   6 4 3 1 1 4    SAB TAB Ectopic Multiple Live Births   1       4      Obstetric Comments   Child #3 was 5 weeks preterm.       Past Medical History:  Diagnosis Date  . Chlamydia   . Leukocytosis 08/07/2015  . Medical history non-contributory   . Obesity   . Trichomonas infection     Past Surgical History:  Procedure Laterality Date  . HIP SURGERY    . JOINT REPLACEMENT    . TUBAL LIGATION      Family History  Problem Relation Age of Onset  . Hypertension Other   . Diabetes Other     Social History  Substance Use Topics  . Smoking status: Former Smoker    Types: Cigarettes    Quit date: 06/07/2015  . Smokeless tobacco: Former NeurosurgeonUser  . Alcohol use No     Comment: occassional     Allergies:  Allergies  Allergen Reactions  . Shellfish Allergy Itching, Swelling and Other (See Comments)    Reaction:  Facial swelling     Prescriptions Prior to Admission  Medication Sig Dispense Refill Last Dose  . acetaminophen (TYLENOL) 500 MG tablet Take 1,000 mg by mouth every 6 (six) hours as needed for mild pain, moderate pain or headache.   Past Month at Unknown time  . calcium carbonate (TUMS - DOSED IN MG ELEMENTAL CALCIUM) 500 MG chewable tablet Chew 4 tablets by mouth as needed for indigestion or heartburn.    01/29/2016 at Unknown time  . Prenat-FeAsp-Meth-FA-DHA w/o A (PRENATE PIXIE) 10-0.6-0.4-200 MG CAPS Take 1 capsule by mouth daily.    Past Week at Unknown time  . ranitidine (ZANTAC) 150 MG capsule Take 1 capsule (150 mg total) by mouth 2 (two) times daily as needed for heartburn. 60 capsule 2 01/28/2016 at Unknown time    Review of Systems  Constitutional: Negative.   Gastrointestinal: Negative.   Genitourinary: Negative.    Physical Exam   Blood pressure 126/75, pulse 89, temperature 98.1 F (36.7 C), temperature source Oral, resp. rate 18, last menstrual period 05/22/2015.  Physical Exam  Nursing note and vitals reviewed. Constitutional: She is oriented to person, place, and time. She appears well-developed and well-nourished. No distress.  HENT:  Head: Normocephalic and atraumatic.  Eyes: Conjunctivae are normal. Right eye exhibits no discharge. Left eye exhibits no discharge. No scleral icterus.  Neck: Normal range of motion.  Respiratory: Effort normal. No respiratory distress.  Neurological: She is alert and oriented to person, place, and time.  Skin: Skin is warm and dry. She is not diaphoretic.  Psychiatric: She has a normal mood and affect. Her behavior is normal. Judgment and thought content normal.   Fetal Tracing:  Baseline: 145 Variability: moderate Accelerations: 15x15 Decelerations: none  Toco: UI  MAU  Course  Procedures No results found for this or any previous visit (from the past 24 hour(s)).  MDM Reactive tracing in MAU Pt had BPP 2 days ago that was 8/8 Pt reassured with tracing here  Assessment and Plan  A: 1. NST (non-stress test) reactive    P: Discharge home Fetal kick counts Keep appt in office on Thursday Discussed reasons to return to MAU   Judeth HornErin Jossalyn Forgione 01/29/2016, 12:46 PM

## 2016-01-29 NOTE — MAU Note (Signed)
Pt sent from MD office for non-reactive NST, states she is having irregular contractions, also mucus discharge.  Denies bleeding or LOF.

## 2016-01-29 NOTE — Progress Notes (Signed)
Patient reports she has had mucus discharge for several days. She has intermittent contractions- but nothing strong enough to signal labor.

## 2016-01-29 NOTE — Progress Notes (Signed)
Patient sent to MAU for non-reactive NST: minimal variability, no accels, no decels, no contractions. FHR: 150.   DX: non-reactive NST

## 2016-01-29 NOTE — Discharge Instructions (Signed)
Fetal Movement Counts  Patient Name: __________________________________________________ Patient Due Date: ____________________  Performing a fetal movement count is highly recommended in high-risk pregnancies, but it is good for every pregnant woman to do. Your health care provider may ask you to start counting fetal movements at 28 weeks of the pregnancy. Fetal movements often increase:  · After eating a full meal.  · After physical activity.  · After eating or drinking something sweet or cold.  · At rest.  Pay attention to when you feel the baby is most active. This will help you notice a pattern of your baby's sleep and wake cycles and what factors contribute to an increase in fetal movement. It is important to perform a fetal movement count at the same time each day when your baby is normally most active.   HOW TO COUNT FETAL MOVEMENTS  1. Find a quiet and comfortable area to sit or lie down on your left side. Lying on your left side provides the best blood and oxygen circulation to your baby.  2. Write down the day and time on a sheet of paper or in a journal.  3. Start counting kicks, flutters, swishes, rolls, or jabs in a 2-hour period. You should feel at least 10 movements within 2 hours.  4. If you do not feel 10 movements in 2 hours, wait 2-3 hours and count again. Look for a change in the pattern or not enough counts in 2 hours.  SEEK MEDICAL CARE IF:  · You feel less than 10 counts in 2 hours, tried twice.  · There is no movement in over an hour.  · The pattern is changing or taking longer each day to reach 10 counts in 2 hours.  · You feel the baby is not moving as he or she usually does.  Date: ____________ Movements: ____________ Start time: ____________ Finish time: ____________   Date: ____________ Movements: ____________ Start time: ____________ Finish time: ____________  Date: ____________ Movements: ____________ Start time: ____________ Finish time: ____________  Date: ____________ Movements:  ____________ Start time: ____________ Finish time: ____________  Date: ____________ Movements: ____________ Start time: ____________ Finish time: ____________  Date: ____________ Movements: ____________ Start time: ____________ Finish time: ____________  Date: ____________ Movements: ____________ Start time: ____________ Finish time: ____________  Date: ____________ Movements: ____________ Start time: ____________ Finish time: ____________   Date: ____________ Movements: ____________ Start time: ____________ Finish time: ____________  Date: ____________ Movements: ____________ Start time: ____________ Finish time: ____________  Date: ____________ Movements: ____________ Start time: ____________ Finish time: ____________  Date: ____________ Movements: ____________ Start time: ____________ Finish time: ____________  Date: ____________ Movements: ____________ Start time: ____________ Finish time: ____________  Date: ____________ Movements: ____________ Start time: ____________ Finish time: ____________  Date: ____________ Movements: ____________ Start time: ____________ Finish time: ____________   Date: ____________ Movements: ____________ Start time: ____________ Finish time: ____________  Date: ____________ Movements: ____________ Start time: ____________ Finish time: ____________  Date: ____________ Movements: ____________ Start time: ____________ Finish time: ____________  Date: ____________ Movements: ____________ Start time: ____________ Finish time: ____________  Date: ____________ Movements: ____________ Start time: ____________ Finish time: ____________  Date: ____________ Movements: ____________ Start time: ____________ Finish time: ____________  Date: ____________ Movements: ____________ Start time: ____________ Finish time: ____________   Date: ____________ Movements: ____________ Start time: ____________ Finish time: ____________  Date: ____________ Movements: ____________ Start time: ____________ Finish  time: ____________  Date: ____________ Movements: ____________ Start time: ____________ Finish time: ____________  Date: ____________ Movements: ____________ Start time:   ____________ Finish time: ____________  Date: ____________ Movements: ____________ Start time: ____________ Finish time: ____________  Date: ____________ Movements: ____________ Start time: ____________ Finish time: ____________  Date: ____________ Movements: ____________ Start time: ____________ Finish time: ____________   Date: ____________ Movements: ____________ Start time: ____________ Finish time: ____________  Date: ____________ Movements: ____________ Start time: ____________ Finish time: ____________  Date: ____________ Movements: ____________ Start time: ____________ Finish time: ____________  Date: ____________ Movements: ____________ Start time: ____________ Finish time: ____________  Date: ____________ Movements: ____________ Start time: ____________ Finish time: ____________  Date: ____________ Movements: ____________ Start time: ____________ Finish time: ____________  Date: ____________ Movements: ____________ Start time: ____________ Finish time: ____________   Date: ____________ Movements: ____________ Start time: ____________ Finish time: ____________  Date: ____________ Movements: ____________ Start time: ____________ Finish time: ____________  Date: ____________ Movements: ____________ Start time: ____________ Finish time: ____________  Date: ____________ Movements: ____________ Start time: ____________ Finish time: ____________  Date: ____________ Movements: ____________ Start time: ____________ Finish time: ____________  Date: ____________ Movements: ____________ Start time: ____________ Finish time: ____________  Date: ____________ Movements: ____________ Start time: ____________ Finish time: ____________   Date: ____________ Movements: ____________ Start time: ____________ Finish time: ____________  Date: ____________  Movements: ____________ Start time: ____________ Finish time: ____________  Date: ____________ Movements: ____________ Start time: ____________ Finish time: ____________  Date: ____________ Movements: ____________ Start time: ____________ Finish time: ____________  Date: ____________ Movements: ____________ Start time: ____________ Finish time: ____________  Date: ____________ Movements: ____________ Start time: ____________ Finish time: ____________  Date: ____________ Movements: ____________ Start time: ____________ Finish time: ____________   Date: ____________ Movements: ____________ Start time: ____________ Finish time: ____________  Date: ____________ Movements: ____________ Start time: ____________ Finish time: ____________  Date: ____________ Movements: ____________ Start time: ____________ Finish time: ____________  Date: ____________ Movements: ____________ Start time: ____________ Finish time: ____________  Date: ____________ Movements: ____________ Start time: ____________ Finish time: ____________  Date: ____________ Movements: ____________ Start time: ____________ Finish time: ____________     This information is not intended to replace advice given to you by your health care provider. Make sure you discuss any questions you have with your health care provider.     Document Released: 04/03/2006 Document Revised: 03/25/2014 Document Reviewed: 12/30/2011  [employer] Interactive Patient Education ©2016 [employer]

## 2016-02-01 ENCOUNTER — Ambulatory Visit (INDEPENDENT_AMBULATORY_CARE_PROVIDER_SITE_OTHER): Payer: 59 | Admitting: Certified Nurse Midwife

## 2016-02-01 DIAGNOSIS — O0992 Supervision of high risk pregnancy, unspecified, second trimester: Secondary | ICD-10-CM

## 2016-02-01 NOTE — Progress Notes (Signed)
Subjective:    Dana Cole is a 35 y.o. female being seen today for her obstetrical visit. She is at 2877w0d gestation. Patient reports backache, no bleeding, no leaking and occasional contractions. Fetal movement: normal.  Problem List Items Addressed This Visit    None     Patient Active Problem List   Diagnosis Date Noted  . Group beta Strep positive 01/29/2016  . Gonorrhea affecting pregnancy in third trimester 01/15/2016  . Supervision of high risk pregnancy in second trimester 10/31/2015  . AMA (advanced maternal age) multigravida 35+ 09/06/2015  . Late prenatal care affecting pregnancy in second trimester, antepartum 09/06/2015  . Surgical history of tubal ligation 09/06/2015  . Leukocytosis 08/07/2015    Objective:    BP 131/82   Pulse 94   Wt 288 lb (130.6 kg)   LMP 05/22/2015 (LMP Unknown)   BMI 42.53 kg/m  FHT: 143 BPM  Uterine Size: 37 cm and size equals dates  Presentations: cephalic  Pelvic Exam:              Dilation: 3cm       Effacement: 50%             Station:  -3    Consistency: medium            Position: middle     Assessment:    Pregnancy @ 2277w0d weeks   AMA  Doing well  Bilateral salpingectomy planned: failed BTL  Plan:   Plans for delivery: Vaginal anticipated; labs reviewed; problem list updated Counseling: Consent signed. Infant feeding: plans to breastfeed. Cigarette smoking: quit at start of pregnancy. L&D discussion: symptoms of labor, discussed when to call, discussed what number to call, anesthetic/analgesic options reviewed and delivering clinician:  plans no preference. Postpartum supports and preparation: circumcision discussed and contraception plans discussed.  Follow up in 1 Week.

## 2016-02-03 ENCOUNTER — Inpatient Hospital Stay (EMERGENCY_DEPARTMENT_HOSPITAL)
Admission: AD | Admit: 2016-02-03 | Discharge: 2016-02-03 | Disposition: A | Discharge status: Home / Self Care | Payer: 59 | Source: Ambulatory Visit | Attending: Family Medicine | Admitting: Family Medicine

## 2016-02-03 ENCOUNTER — Encounter (HOSPITAL_COMMUNITY): Payer: Self-pay | Admitting: *Deleted

## 2016-02-03 DIAGNOSIS — O26893 Other specified pregnancy related conditions, third trimester: Secondary | ICD-10-CM

## 2016-02-03 DIAGNOSIS — Z3A37 37 weeks gestation of pregnancy: Secondary | ICD-10-CM

## 2016-02-03 DIAGNOSIS — N898 Other specified noninflammatory disorders of vagina: Secondary | ICD-10-CM

## 2016-02-03 DIAGNOSIS — Z87891 Personal history of nicotine dependence: Secondary | ICD-10-CM

## 2016-02-03 NOTE — MAU Note (Signed)
Contractions for past 2 hrs. Intercourse this am. Denies vaginal bleeding .  ? Water broke

## 2016-02-03 NOTE — MAU Provider Note (Signed)
History     CSN: [id number]  Arrival date and time: 02/03/16 2135   None     Chief Complaint  Patient presents with  . Contractions   And is a 35 year old G6 P4 at 37 weeks and 2 days who presents with concern for contractions and rupture membranes. She reports she had aggressive fluid this a.m. and then some intermittent leaking throughout the day. She has no other concerns or complaints. She reports regular fetal movement no vaginal bleeding.    OB History    Gravida Para Term Preterm AB Living   6 4 3 1 1 4    SAB TAB Ectopic Multiple Live Births   1       4      Obstetric Comments   Child #3 was 5 weeks preterm.       Past Medical History:  Diagnosis Date  . Chlamydia   . Leukocytosis 08/07/2015  . Obesity   . Trichomonas infection     Past Surgical History:  Procedure Laterality Date  . HIP SURGERY    . JOINT REPLACEMENT    . TUBAL LIGATION      Family History  Problem Relation Age of Onset  . Hypertension Other   . Diabetes Other     Social History  Substance Use Topics  . Smoking status: Former Smoker    Types: Cigarettes    Quit date: 06/07/2015  . Smokeless tobacco: Former NeurosurgeonUser  . Alcohol use No     Comment: occassional     Allergies:  Allergies  Allergen Reactions  . Shellfish Allergy Itching, Swelling and Other (See Comments)    Reaction:  Facial swelling     Prescriptions Prior to Admission  Medication Sig Dispense Refill Last Dose  . acetaminophen (TYLENOL) 500 MG tablet Take 1,000 mg by mouth every 6 (six) hours as needed for mild pain, moderate pain or headache.   Not Taking  . calcium carbonate (TUMS - DOSED IN MG ELEMENTAL CALCIUM) 500 MG chewable tablet Chew 4 tablets by mouth as needed for indigestion or heartburn.    Taking  . Prenat-FeAsp-Meth-FA-DHA w/o A (PRENATE PIXIE) 10-0.6-0.4-200 MG CAPS Take 1 capsule by mouth daily.   Taking  . ranitidine (ZANTAC) 150 MG capsule Take 1 capsule (150 mg total) by mouth 2 (two) times  daily as needed for heartburn. 60 capsule 2 Taking    Review of Systems  Constitutional: Negative for chills and fever.  Gastrointestinal: Negative for abdominal pain and vomiting.  Genitourinary: Negative for dysuria, frequency and urgency.   Physical Exam   Blood pressure (P) 143/81, pulse (P) 93, temperature (P) 98.3 F (36.8 C), temperature source (P) Oral, resp. rate (P) 18, last menstrual period 05/22/2015.  Physical Exam  Constitutional: She is oriented to person, place, and time. She appears well-developed and well-nourished.  GI: She exhibits no distension. There is no tenderness. There is no rebound.  Gravid  Genitourinary:  Genitourinary Comments: Mucousy discharge. Cervix 3 cm 80 and -3. No pooling negative Fern  Neurological: She is alert and oriented to person, place, and time.  Skin: Skin is warm and dry.  Psychiatric: She has a normal mood and affect. Her behavior is normal.    MAU Course  Procedures  MDM In MAU patient underwent continuous fetal monitoring. There was concern for rupture of membranes. Ferning done by the nurse with a blind swab was negative. Speculum exam was performed which showed no pooling and a second fern slide was collected.  This was also negative. Mucus discharge was seen. My cervical exam was 380 and -3 which was unchanged from the office when she was last checked. Fetal heart tracing was reviewed and showed a reactive test. Patient is to be discharged home and is in agreement with the plan.  Assessment and Plan  1. Concern for rupture membranes/vaginal discharge: Patient does not appear to have bacterial vaginosis yeast infection or rupture membranes. She does not appear to be in labor. Patient okay for discharge home.  Ernestina Pennaicholas Natoya Viscomi 02/03/2016, 11:04 PM

## 2016-02-03 NOTE — Discharge Instructions (Signed)
Third Trimester of Pregnancy The third trimester is from week 29 through week 40 (months 7 through 9). The third trimester is a time when the unborn baby (fetus) is growing rapidly. At the end of the ninth month, the fetus is about 20 inches in length and weighs 6-10 pounds. Body changes during your third trimester Your body goes through many changes during pregnancy. The changes vary from woman to woman. During the third trimester:  Your weight will continue to increase. You can expect to gain 25-35 pounds (11-16 kg) by the end of the pregnancy.  You may begin to get stretch marks on your hips, abdomen, and breasts.  You may urinate more often because the fetus is moving lower into your pelvis and pressing on your bladder.  You may develop or continue to have heartburn. This is caused by increased hormones that slow down muscles in the digestive tract.  You may develop or continue to have constipation because increased hormones slow digestion and cause the muscles that push waste through your intestines to relax.  You may develop hemorrhoids. These are swollen veins (varicose veins) in the rectum that can itch or be painful.  You may develop swollen, bulging veins (varicose veins) in your legs.  You may have increased body aches in the pelvis, back, or thighs. This is due to weight gain and increased hormones that are relaxing your joints.  You may have changes in your hair. These can include thickening of your hair, rapid growth, and changes in texture. Some women also have hair loss during or after pregnancy, or hair that feels dry or thin. Your hair will most likely return to normal after your baby is born.  Your breasts will continue to grow and they will continue to become tender. A yellow fluid (colostrum) may leak from your breasts. This is the first milk you are producing for your baby.  Your belly button may stick out.  You may notice more swelling in your hands, face, or  ankles.  You may have increased tingling or numbness in your hands, arms, and legs. The skin on your belly may also feel numb.  You may feel short of breath because of your expanding uterus.  You may have more problems sleeping. This can be caused by the size of your belly, increased need to urinate, and an increase in your body's metabolism.  You may notice the fetus "dropping," or moving lower in your abdomen.  You may have increased vaginal discharge.  Your cervix becomes thin and soft (effaced) near your due date. What to expect at prenatal visits You will have prenatal exams every 2 weeks until week 36. Then you will have weekly prenatal exams. During a routine prenatal visit:  You will be weighed to make sure you and the fetus are growing normally.  Your blood pressure will be taken.  Your abdomen will be measured to track your baby's growth.  The fetal heartbeat will be listened to.  Any test results from the previous visit will be discussed.  You may have a cervical check near your due date to see if you have effaced. At around 36 weeks, your health care provider will check your cervix. At the same time, your health care provider will also perform a test on the secretions of the vaginal tissue. This test is to determine if a type of bacteria, Group B streptococcus, is present. Your health care provider will explain this further. Your health care provider may ask you:    What your birth plan is.  How you are feeling.  If you are feeling the baby move.  If you have had any abnormal symptoms, such as leaking fluid, bleeding, severe headaches, or abdominal cramping.  If you are using any tobacco products, including cigarettes, chewing tobacco, and electronic cigarettes.  If you have any questions. Other tests or screenings that may be performed during your third trimester include:  Blood tests that check for low iron levels (anemia).  Fetal testing to check the health,  activity level, and growth of the fetus. Testing is done if you have certain medical conditions or if there are problems during the pregnancy.  Nonstress test (NST). This test checks the health of your baby to make sure there are no signs of problems, such as the baby not getting enough oxygen. During this test, a belt is placed around your belly. The baby is made to move, and its heart rate is monitored during movement. What is false labor? False labor is a condition in which you feel small, irregular tightenings of the muscles in the womb (contractions) that eventually go away. These are called Braxton Hicks contractions. Contractions may last for hours, days, or even weeks before true labor sets in. If contractions come at regular intervals, become more frequent, increase in intensity, or become painful, you should see your health care provider. What are the signs of labor?  Abdominal cramps.  Regular contractions that start at 10 minutes apart and become stronger and more frequent with time.  Contractions that start on the top of the uterus and spread down to the lower abdomen and back.  Increased pelvic pressure and dull back pain.  A watery or bloody mucus discharge that comes from the vagina.  Leaking of amniotic fluid. This is also known as your "water breaking." It could be a slow trickle or a gush. Let your doctor know if it has a color or strange odor. If you have any of these signs, call your health care provider right away, even if it is before your due date. Follow these instructions at home: Eating and drinking  Continue to eat regular, healthy meals.  Do not eat:  Raw meat or meat spreads.  Unpasteurized milk or cheese.  Unpasteurized juice.  Store-made salad.  Refrigerated smoked seafood.  Hot dogs or deli meat, unless they are piping hot.  More than 6 ounces of albacore tuna a week.  Shark, swordfish, king mackerel, or tile fish.  Store-made salads.  Raw  sprouts, such as mung bean or alfalfa sprouts.  Take prenatal vitamins as told by your health care provider.  Take 1000 mg of calcium daily as told by your health care provider.  If you develop constipation:  Take over-the-counter or prescription medicines.  Drink enough fluid to keep your urine clear or pale yellow.  Eat foods that are high in fiber, such as fresh fruits and vegetables, whole grains, and beans.  Limit foods that are high in fat and processed sugars, such as fried and sweet foods. Activity  Exercise only as directed by your health care provider. Healthy pregnant women should aim for 2 hours and 30 minutes of moderate exercise per week. If you experience any pain or discomfort while exercising, stop.  Avoid heavy lifting.  Do not exercise in extreme heat or humidity, or at high altitudes.  Wear low-heel, comfortable shoes.  Practice good posture.  Do not travel far distances unless it is absolutely necessary and only with the approval   of your health care provider.  Wear your seat belt at all times while in a car, on a bus, or on a plane.  Take frequent breaks and rest with your legs elevated if you have leg cramps or low back pain.  Do not use hot tubs, steam rooms, or saunas.  You may continue to have sex unless your health care provider tells you otherwise. Lifestyle  Do not use any products that contain nicotine or tobacco, such as cigarettes and e-cigarettes. If you need help quitting, ask your health care provider.  Do not drink alcohol.  Do not use any medicinal herbs or unprescribed drugs. These chemicals affect the formation and growth of the baby.  If you develop varicose veins:  Wear support pantyhose or compression stockings as told by your healthcare provider.  Elevate your feet for 15 minutes, 3-4 times a day.  Wear a supportive maternity bra to help with breast tenderness. General instructions  Take over-the-counter and prescription  medicines only as told by your health care provider. There are medicines that are either safe or unsafe to take during pregnancy.  Take warm sitz baths to soothe any pain or discomfort caused by hemorrhoids. Use hemorrhoid cream or witch hazel if your health care provider approves.  Avoid cat litter boxes and soil used by cats. These carry germs that can cause birth defects in the baby. If you have a cat, ask someone to clean the litter box for you.  To prepare for the arrival of your baby:  Take prenatal classes to understand, practice, and ask questions about the labor and delivery.  Make a trial run to the hospital.  Visit the hospital and tour the maternity area.  Arrange for maternity or paternity leave through employers.  Arrange for family and friends to take care of pets while you are in the hospital.  Purchase a rear-facing car seat and make sure you know how to install it in your car.  Pack your hospital bag.  Prepare the baby's nursery. Make sure to remove all pillows and stuffed animals from the baby's crib to prevent suffocation.  Visit your dentist if you have not gone during your pregnancy. Use a soft toothbrush to brush your teeth and be gentle when you floss.  Keep all prenatal follow-up visits as told by your health care provider. This is important. Contact a health care provider if:  You are unsure if you are in labor or if your water has broken.  You become dizzy.  You have mild pelvic cramps, pelvic pressure, or nagging pain in your abdominal area.  You have lower back pain.  You have persistent nausea, vomiting, or diarrhea.  You have an unusual or bad smelling vaginal discharge.  You have pain when you urinate. Get help right away if:  You have a fever.  You are leaking fluid from your vagina.  You have spotting or bleeding from your vagina.  You have severe abdominal pain or cramping.  You have rapid weight loss or weight gain.  You have  shortness of breath with chest pain.  You notice sudden or extreme swelling of your face, hands, ankles, feet, or legs.  Your baby makes fewer than 10 movements in 2 hours.  You have severe headaches that do not go away with medicine.  You have vision changes. Summary  The third trimester is from week 29 through week 40, months 7 through 9. The third trimester is a time when the unborn baby (fetus)   is growing rapidly.  During the third trimester, your discomfort may increase as you and your baby continue to gain weight. You may have abdominal, leg, and back pain, sleeping problems, and an increased need to urinate.  During the third trimester your breasts will keep growing and they will continue to become tender. A yellow fluid (colostrum) may leak from your breasts. This is the first milk you are producing for your baby.  False labor is a condition in which you feel small, irregular tightenings of the muscles in the womb (contractions) that eventually go away. These are called Braxton Hicks contractions. Contractions may last for hours, days, or even weeks before true labor sets in.  Signs of labor can include: abdominal cramps; regular contractions that start at 10 minutes apart and become stronger and more frequent with time; watery or bloody mucus discharge that comes from the vagina; increased pelvic pressure and dull back pain; and leaking of amniotic fluid. This information is not intended to replace advice given to you by your health care provider. Make sure you discuss any questions you have with your health care provider. Document Released: 02/26/2001 Document Revised: 08/10/2015 Document Reviewed: 05/05/2012 Elsevier Interactive Patient Education  2017 Elsevier Inc.  

## 2016-02-04 ENCOUNTER — Inpatient Hospital Stay (HOSPITAL_COMMUNITY)
Admission: AD | Admit: 2016-02-04 | Discharge: 2016-02-06 | DRG: 775 | Disposition: A | Discharge status: Home / Self Care | Payer: 59 | Source: Ambulatory Visit | Attending: Family Medicine | Admitting: Family Medicine

## 2016-02-04 ENCOUNTER — Encounter (HOSPITAL_COMMUNITY): Payer: Self-pay | Admitting: *Deleted

## 2016-02-04 DIAGNOSIS — O99214 Obesity complicating childbirth: Secondary | ICD-10-CM | POA: Diagnosis present

## 2016-02-04 DIAGNOSIS — O99824 Streptococcus B carrier state complicating childbirth: Principal | ICD-10-CM | POA: Diagnosis present

## 2016-02-04 DIAGNOSIS — Z833 Family history of diabetes mellitus: Secondary | ICD-10-CM

## 2016-02-04 DIAGNOSIS — Z3A37 37 weeks gestation of pregnancy: Secondary | ICD-10-CM

## 2016-02-04 DIAGNOSIS — Z87891 Personal history of nicotine dependence: Secondary | ICD-10-CM

## 2016-02-04 DIAGNOSIS — E669 Obesity, unspecified: Secondary | ICD-10-CM | POA: Diagnosis present

## 2016-02-04 DIAGNOSIS — Z6841 Body Mass Index (BMI) 40.0 and over, adult: Secondary | ICD-10-CM

## 2016-02-04 DIAGNOSIS — Z3403 Encounter for supervision of normal first pregnancy, third trimester: Secondary | ICD-10-CM | POA: Diagnosis present

## 2016-02-04 DIAGNOSIS — Z8249 Family history of ischemic heart disease and other diseases of the circulatory system: Secondary | ICD-10-CM

## 2016-02-04 LAB — CBC
HEMATOCRIT: 34.7 % — AB (ref 36.0–46.0)
HEMATOCRIT: 32.9 % — AB (ref 36.0–46.0)
HEMOGLOBIN: 12.1 g/dL (ref 12.0–15.0)
Hemoglobin: 11.9 g/dL — ABNORMAL LOW (ref 12.0–15.0)
MCH: 30.3 pg (ref 26.0–34.0)
MCH: 31.2 pg (ref 26.0–34.0)
MCHC: 34.9 g/dL (ref 30.0–36.0)
MCHC: 36.2 g/dL — AB (ref 30.0–36.0)
MCV: 87 fL (ref 78.0–100.0)
MCV: 86.1 fL (ref 78.0–100.0)
Platelets: 164 10*3/uL (ref 150–400)
Platelets: 166 10*3/uL (ref 150–400)
RBC: 3.99 MIL/uL (ref 3.87–5.11)
RBC: 3.82 MIL/uL — ABNORMAL LOW (ref 3.87–5.11)
RDW: 13.4 % (ref 11.5–15.5)
RDW: 13.5 % (ref 11.5–15.5)
WBC: 9.5 10*3/uL (ref 4.0–10.5)
WBC: 13.5 10*3/uL — ABNORMAL HIGH (ref 4.0–10.5)

## 2016-02-04 LAB — TYPE AND SCREEN
ABO/RH(D): AB POS
ANTIBODY SCREEN: NEGATIVE

## 2016-02-04 LAB — RPR: RPR Ser Ql: NONREACTIVE

## 2016-02-04 MED ORDER — MISOPROSTOL 200 MCG PO TABS
ORAL_TABLET | ORAL | Status: AC
  Administered 2016-02-04: 800 ug
  Filled 2016-02-04: qty 4

## 2016-02-04 MED ORDER — COCONUT OIL OIL: 1.0000 "application " | TOPICAL_OIL | Status: DC | PRN

## 2016-02-04 MED ORDER — ONDANSETRON HCL 4 MG PO TABS
4.0000 mg | ORAL_TABLET | ORAL | Status: DC | PRN
Start: 2016-02-04 — End: 2016-02-06

## 2016-02-04 MED ORDER — INFLUENZA VAC SPLIT QUAD 0.5 ML IM SUSY
0.5000 mL | PREFILLED_SYRINGE | INTRAMUSCULAR | Status: AC
  Administered 2016-02-05: 0.5 mL via INTRAMUSCULAR
  Filled 2016-02-04: qty 0.5

## 2016-02-04 MED ORDER — DIPHENHYDRAMINE HCL 25 MG PO CAPS: 25.0000 mg | ORAL_CAPSULE | Freq: Four times a day (QID) | ORAL | Status: DC | PRN

## 2016-02-04 MED ORDER — IBUPROFEN 600 MG PO TABS
600.0000 mg | ORAL_TABLET | Freq: Four times a day (QID) | ORAL | Status: DC
  Administered 2016-02-04 – 2016-02-06 (×9): 600 mg via ORAL
  Filled 2016-02-04 (×10): qty 1

## 2016-02-04 MED ORDER — ONDANSETRON HCL 4 MG/2ML IJ SOLN: 4.0000 mg | INTRAMUSCULAR | Status: DC | PRN

## 2016-02-04 MED ORDER — OXYTOCIN BOLUS FROM INFUSION: 500.0000 mL | Freq: Once | INTRAVENOUS | Status: DC

## 2016-02-04 MED ORDER — DIBUCAINE 1 % RE OINT: 1.0000 "application " | TOPICAL_OINTMENT | RECTAL | Status: DC | PRN

## 2016-02-04 MED ORDER — OXYCODONE-ACETAMINOPHEN 5-325 MG PO TABS: 2.0000 | ORAL_TABLET | ORAL | Status: DC | PRN

## 2016-02-04 MED ORDER — OXYTOCIN 10 UNIT/ML IJ SOLN: 10.0000 [IU] | Freq: Once | INTRAMUSCULAR | Status: DC

## 2016-02-04 MED ORDER — ACETAMINOPHEN 325 MG PO TABS
650.0000 mg | ORAL_TABLET | ORAL | Status: DC | PRN
  Administered 2016-02-06: 650 mg via ORAL
  Filled 2016-02-04 (×2): qty 2

## 2016-02-04 MED ORDER — WITCH HAZEL-GLYCERIN EX PADS: 1.0000 "application " | MEDICATED_PAD | CUTANEOUS | Status: DC | PRN

## 2016-02-04 MED ORDER — LACTATED RINGERS IV SOLN: 500.0000 mL | INTRAVENOUS | Status: DC | PRN

## 2016-02-04 MED ORDER — OXYTOCIN 40 UNITS IN LACTATED RINGERS INFUSION - SIMPLE MED: 2.5000 [IU]/h | INTRAVENOUS | Status: DC

## 2016-02-04 MED ORDER — ZOLPIDEM TARTRATE 5 MG PO TABS
5.0000 mg | ORAL_TABLET | Freq: Every evening | ORAL | Status: DC | PRN
Start: 2016-02-04 — End: 2016-02-06

## 2016-02-04 MED ORDER — SOD CITRATE-CITRIC ACID 500-334 MG/5ML PO SOLN: 30.0000 mL | ORAL | Status: DC | PRN

## 2016-02-04 MED ORDER — LIDOCAINE HCL (PF) 1 % IJ SOLN
30.0000 mL | INTRAMUSCULAR | Status: DC | PRN
  Filled 2016-02-04: qty 30

## 2016-02-04 MED ORDER — BENZOCAINE-MENTHOL 20-0.5 % EX AERO
1.0000 "application " | INHALATION_SPRAY | CUTANEOUS | Status: DC | PRN
  Administered 2016-02-04: 1 via TOPICAL
  Filled 2016-02-04: qty 56

## 2016-02-04 MED ORDER — ONDANSETRON HCL 4 MG/2ML IJ SOLN: 4.0000 mg | Freq: Four times a day (QID) | INTRAMUSCULAR | Status: DC | PRN

## 2016-02-04 MED ORDER — OXYCODONE-ACETAMINOPHEN 5-325 MG PO TABS
1.0000 | ORAL_TABLET | ORAL | Status: DC | PRN
  Administered 2016-02-04 (×4): 1 via ORAL
  Filled 2016-02-04 (×4): qty 1

## 2016-02-04 MED ORDER — SENNOSIDES-DOCUSATE SODIUM 8.6-50 MG PO TABS
2.0000 | ORAL_TABLET | ORAL | Status: DC
  Administered 2016-02-04: 2 via ORAL
  Filled 2016-02-04: qty 2

## 2016-02-04 MED ORDER — TETANUS-DIPHTH-ACELL PERTUSSIS 5-2.5-18.5 LF-MCG/0.5 IM SUSP: 0.5000 mL | Freq: Once | INTRAMUSCULAR | Status: DC

## 2016-02-04 MED ORDER — LACTATED RINGERS IV SOLN: INTRAVENOUS | Status: DC

## 2016-02-04 MED ORDER — SIMETHICONE 80 MG PO CHEW: 80.0000 mg | CHEWABLE_TABLET | ORAL | Status: DC | PRN

## 2016-02-04 MED ORDER — MISOPROSTOL 200 MCG PO TABS: 800.0000 ug | ORAL_TABLET | Freq: Once | ORAL | Status: DC

## 2016-02-04 MED ORDER — ACETAMINOPHEN 325 MG PO TABS
650.0000 mg | ORAL_TABLET | ORAL | Status: DC | PRN
Start: 2016-02-04 — End: 2016-02-06
  Administered 2016-02-05: 650 mg via ORAL

## 2016-02-04 MED ORDER — OXYTOCIN 10 UNIT/ML IJ SOLN
INTRAMUSCULAR | Status: AC
  Administered 2016-02-04: 10 [IU]
  Filled 2016-02-04: qty 1

## 2016-02-04 MED ORDER — PRENATAL MULTIVITAMIN CH
1.0000 | ORAL_TABLET | Freq: Every day | ORAL | Status: DC
  Administered 2016-02-04 – 2016-02-05 (×2): 1 via ORAL
  Filled 2016-02-04 (×2): qty 1

## 2016-02-04 NOTE — H&P (Signed)
LABOR ADMISSION HISTORY AND PHYSICAL  Dana Cole is a 35 y.o. female [phone] with IUP at 6268w3d by 6w U/S presenting for active labor. She reports +FM, + contractions, possible LOF, no VB, no blurry vision, headaches or peripheral edema, and RUQ pain.  She plans on breast feeding. She requests salpingectomy for birth control.  Dating: By 6 week [place] --->  Estimated Date of Delivery: 02/22/16  Sono:    @ 553w5d, CWD, normal anatomy, cephalic presentation, transverse lie, 2731 g, 86% EFW   Prenatal History/Complications: AMA Obesity (BMI 42) Positive for gonorrhea 01/10/16 -- treated Late prenatal care Failed BTL  Past Medical History: Past Medical History:  Diagnosis Date  . Chlamydia   . Leukocytosis 08/07/2015  . Obesity   . Trichomonas infection     Past Surgical History: Past Surgical History:  Procedure Laterality Date  . HIP SURGERY    . JOINT REPLACEMENT    . TUBAL LIGATION      Obstetrical History: OB History    Gravida Para Term Preterm AB Living   6 4 3 1 1 4    SAB TAB Ectopic Multiple Live Births   1       4      Obstetric Comments   Child #3 was 5 weeks preterm.       Social History: Social History   Social History  . Marital status: Single    Spouse name: N/A  . Number of children: N/A  . Years of education: N/A   Social History Main Topics  . Smoking status: Former Smoker    Types: Cigarettes    Quit date: 06/07/2015  . Smokeless tobacco: Former NeurosurgeonUser  . Alcohol use No     Comment: occassional   . Drug use:     Types: Marijuana     Comment: last marijuana 10/2015  . Sexual activity: No     Comment: liives with 4 children, works at [employer]   Other Topics Concern  . Not on file   Social History Narrative  . No narrative on file    Family History: Family History  Problem Relation Age of Onset  . Hypertension Other   . Diabetes Other     Allergies: Allergies  Allergen Reactions  . Shellfish Allergy Itching, Swelling and Other  (See Comments)    Reaction:  Facial swelling     Prescriptions Prior to Admission  Medication Sig Dispense Refill Last Dose  . acetaminophen (TYLENOL) 500 MG tablet Take 1,000 mg by mouth every 6 (six) hours as needed for mild pain, moderate pain or headache.   Not Taking  . calcium carbonate (TUMS - DOSED IN MG ELEMENTAL CALCIUM) 500 MG chewable tablet Chew 4 tablets by mouth as needed for indigestion or heartburn.    Taking  . Prenat-FeAsp-Meth-FA-DHA w/o A (PRENATE PIXIE) 10-0.6-0.4-200 MG CAPS Take 1 capsule by mouth daily.   Taking  . ranitidine (ZANTAC) 150 MG capsule Take 1 capsule (150 mg total) by mouth 2 (two) times daily as needed for heartburn. 60 capsule 2 Taking     Review of Systems   All systems reviewed and negative except as stated in HPI  LMP 05/22/2015 (LMP Unknown)  General appearance: alert and moderate distress Lungs: clear to auscultation bilaterally Heart: regular rate and rhythm Abdomen: soft, non-tender; bowel sounds normal Extremities: Homans sign is negative, no sign of DVT, edema Presentation: cephalic Fetal monitoring: not on Uterine activity: regular contractions   Prenatal labs: ABO, Rh: AB/Positive/-- (06/21  1429) Antibody: Negative (06/21 1429) Rubella: Immune RPR: Non Reactive (09/12 0945)  HBsAg: Negative (06/21 1429)  HIV: Non Reactive (09/12 0945)  GBS: Positive (11/09 1535)  3-hr GTT: 86/161/116 Genetic screening: MaterniT21 PLUS normal Anatomy US: normal @ 19 wks  Prenatal Transfer Tool  Maternal Diabetes: No Genetic Screening: Normal Maternal Ultrasounds/Referrals: Normal Fetal Ultrasounds or other Referrals:  None Maternal Substance Abuse:  No Significant Maternal Medications:  None Significant Maternal Lab Results: Lab values include: Group B Strep positive  No results found for this or any previous visit (from the past 24 hour(s)).  Patient Active Problem List   Diagnosis Date Noted  . Group beta Strep positive  01/29/2016  . Gonorrhea affecting pregnancy in third trimester 01/15/2016  . Supervision of high risk pregnancy in second trimester 10/31/2015  . AMA (advanced maternal age) multigravida 35+ 09/06/2015  . Late prenatal care affecting pregnancy in second trimester, antepartum 09/06/2015  . Surgical history of tubal ligation 09/06/2015  . Leukocytosis 08/07/2015    Assessment: Dana Cole is a 35 y.o. [id number] at 7781w3d here for active labor.   #Labor: Expectant management #Pain: None given #FWB:  Was not on monitors #ID:  GBS +, complete and pushing on admission so did not receive prophylaxis; do not see test of cure for gonorrhea -- recommend urine gc/chlamydia #MOF: Breast #MOC: Bilateral salpingectomy (failed BTL) #Circ: Yes (outpatient)  Dani GobbleHillary Fitzgerald, MD [facility], PGY-2  OB FELLOW HISTORY AND PHYSICAL ATTESTATION  I have seen and examined this patient; I agree with above documentation in the resident's note.    Ernestina Pennaicholas Doraine Schexnider 02/04/2016, 6:12 AM

## 2016-02-04 NOTE — MAU Note (Signed)
Pt came into MAU via EMS stretcher. Pt very uncomfortable with ctxs. SVE complete at 0 station. Pt states no SROM. Pt was MAU earlier for labor ck and 3cm at that time. BS called and pt to 168 via EMS stretcher.

## 2016-02-05 NOTE — Clinical Social Work Maternal (Signed)
  CLINICAL SOCIAL WORK MATERNAL/CHILD NOTE  Patient Details  Name: Dana Cole MRN: [id number] Date of Birth: Apr 16, 1980  Date:  02/05/2016  Clinical Social Worker Initiating Note:  Laurey Arrow Date/ Time Initiated:  02/05/16/1434     Child's Name:  Dana Cole   Legal Guardian:  Mother   Need for Interpreter:  None   Date of Referral:  02/05/16     Reason for Referral:  Current Substance Use/Substance Use During Pregnancy  [facility] use during pregnancy.)   Referral Source:  Central Nursery   Address:  [address] [place] [address]  Phone number:  [phone]   Household Members:  Self, Friends, Minor Children   Natural Supports (not living in the home):  Immediate Family, Extended Family   Professional Supports: None   Employment: Full-time   Type of Work: [employer]   Education:  9 to 11 years   Museum/gallery curator Resources:  [employer]   Other Resources:  Physicist, medical , [place] Considerations Which May Impact Care:  Per [employer], MOB is [employer].   Strengths:  Ability to meet basic needs , Pediatrician chosen , Home prepared for child    Risk Factors/Current Problems:  Other (Comment) (Substance Use ;Other (Comment); hx of CPS involvement.)   Cognitive State:  Able to Concentrate , Alert , Linear Thinking    Mood/Affect:  Bright , Happy , Interested    CSW Assessment: CSW met with MOB to complete an assessment for SA hx.  When CSW arrived, MOB was relaxing in the bed with infant beside MOB. MOB gave CSW permission to meet with MOB while MOB's brother Clois Comber) was present.  MOB was polite, inviting, and interested in meeting with CSW. CSW inquired about MOB's SA hx and MOB acknowledged a SA hx. MOB reported the last use of marijuana was about 3 days ago. CSW thanked MOB for being honest and informed MOB of the hospital's drug screen policy. CSW informed MOB of the 2 screenings for the infant.  CSW explained that the  infant's UDS was positive, and CSW will be making a report to [facility] CPS.  CSW also made MOB aware that CSW will continue to monitor the infant's Cord, and will report the finding to [place].  MOB was understanding and reported a hx of CPS involvement.  MOB reported that MOB does not currently have an open CPS case however; a CPS case was closed on MOB's family less than 30 days ago for neglect. CSW offered MOB SA community resources and MOB declined. CSW educated MOB about PPD; MOB denied PPD with MOB's older children. CSW informed MOB of possible supports and interventions to decrease PPD.  CSW also encouraged MOB to seek medical attention if needed for increased signs and symptoms for PPD. CSW reviewed safe sleep, and SIDS. MOB asked appropriate questions and appeared to be knowledgeable.  MOB communicated that she has a bassinet for the baby, and feels prepared for the infant.  MOB did not have any further questions, concerns, or needs at this time. CSW Plan/Description:  Child Copy Report , Patient/Family Education , Information/Referral to [employer]  (CPS report made to [facility] CPS.  Infant should not be d/c until CPS inform CSW of CPS plan of care. )   Laurey Arrow, MSW, LCSW Clinical Social Work [phone]    Dimple Nanas, LCSW 02/05/2016, 2:44 PM

## 2016-02-05 NOTE — Plan of Care (Signed)
Problem: Coping: Goal: Ability to identify and utilize available resources and services will improve Outcome: Completed/Met Date Met: 02/05/16 Patient has experience with 4 other children and has excellent support system in room to assist with baby.

## 2016-02-05 NOTE — Progress Notes (Signed)
Post Partum Day #1 Subjective: up ad lib, voiding, tolerating PO and reports cramping even with Percocet and motrin  Objective: Blood pressure 125/82, pulse 62, temperature 97.9 F (36.6 C), temperature source Oral, resp. rate (!) 62, last menstrual period 05/22/2015, SpO2 99 %.  Physical Exam:  General: alert, cooperative and no distress Lochia: appropriate Uterine Fundus: firm Incision: none DVT Evaluation: No evidence of DVT seen on physical exam. No cords or calf tenderness. No significant calf/ankle edema.   Recent Labs  02/04/16 0300 02/04/16 0528  HGB 12.1 11.9*  HCT 34.7* 32.9*    Assessment/Plan: Plan for discharge tomorrow and Contraception outpatient salpingectomy   LOS: 1 day   Roe CoombsRachelle A Johnnetta Holstine, CNM 02/05/2016, 7:29 AM

## 2016-02-06 ENCOUNTER — Encounter (HOSPITAL_COMMUNITY): Payer: Self-pay

## 2016-02-06 ENCOUNTER — Encounter (HOSPITAL_COMMUNITY): Payer: Self-pay | Admitting: *Deleted

## 2016-02-06 ENCOUNTER — Ambulatory Visit (HOSPITAL_COMMUNITY)
Admission: RE | Admit: 2016-02-06 | Discharge: 2016-02-06 | Disposition: A | Discharge status: Home / Self Care | Payer: No Typology Code available for payment source | Source: Ambulatory Visit | Attending: Certified Nurse Midwife | Admitting: Certified Nurse Midwife

## 2016-02-06 MED ORDER — IBUPROFEN 600 MG PO TABS: 600.0000 mg | ORAL_TABLET | Freq: Four times a day (QID) | ORAL | 2 refills | Status: DC

## 2016-02-06 MED ORDER — SENNOSIDES-DOCUSATE SODIUM 8.6-50 MG PO TABS: 2.0000 | ORAL_TABLET | Freq: Two times a day (BID) | ORAL | 2 refills | Status: DC

## 2016-02-06 MED ORDER — OXYCODONE-ACETAMINOPHEN 5-325 MG PO TABS: 1.0000 | ORAL_TABLET | ORAL | 0 refills | Status: DC | PRN

## 2016-02-06 NOTE — Progress Notes (Signed)
CSW received a voicemail message from CPS worker, Kenya Herndon.  CPS informed CSW that there are no barriers to d/c and CPS will follow up with MOB at a later date.   Dana Cole, MSW, LCSW Clinical Social Work [phone]  

## 2016-02-06 NOTE — Progress Notes (Signed)
Post Partum Day #2 Subjective: no complaints, up ad lib, voiding and tolerating PO  Objective: Blood pressure 130/60, pulse 61, temperature 98.3 F (36.8 C), resp. rate 18, last menstrual period 05/22/2015, SpO2 99 %.  Physical Exam:  General: alert, cooperative and no distress Lochia: appropriate Uterine Fundus: firm Incision: none DVT Evaluation: No evidence of DVT seen on physical exam. No cords or calf tenderness. No significant calf/ankle edema.   Recent Labs  02/04/16 0300 02/04/16 0528  HGB 12.1 11.9*  HCT 34.7* 32.9*    Assessment/Plan: Discharge home, Breastfeeding and Contraception outpatient salpingectomy, failed BTL.   LOS: 2 days   Dana Cole, CNM 02/06/2016, 7:42 AM

## 2016-02-06 NOTE — Discharge Summary (Signed)
Obstetric Discharge Summary Reason for Admission: onset of labor and 6323w3d Prenatal Procedures: NST and ultrasound Intrapartum Procedures: spontaneous vaginal delivery Postpartum Procedures: none Complications-Operative and Postpartum: none Hemoglobin  Date Value Ref Range Status  02/04/2016 11.9 (L) 12.0 - 15.0 g/dL Final   HGB  Date Value Ref Range Status  08/07/2015 12.2 11.6 - 15.9 g/dL Final   HCT  Date Value Ref Range Status  02/04/2016 32.9 (L) 36.0 - 46.0 % Final  08/07/2015 35.3 34.8 - 46.6 % Final   Hematocrit  Date Value Ref Range Status  11/28/2015 33.7 (L) 34.0 - 46.6 % Final    Physical Exam:  General: alert, cooperative and no distress Lochia: appropriate Uterine Fundus: firm Incision: none DVT Evaluation: No evidence of DVT seen on physical exam. No cords or calf tenderness. No significant calf/ankle edema.  Discharge Diagnoses: Term Pregnancy-delivered and AMA  Discharge Information: Date: 02/06/2016 Activity: pelvic rest Diet: routine Medications: PNV, Ibuprofen, Colace and Percocet Condition: stable Instructions: refer to practice specific booklet Discharge to: home Follow-up Information    [facility] Follow up in 4 week(s).   Specialty:  Obstetrics and Gynecology Why:  Schedule salpingectomy Contact information: [address], [address] [place] [id number] [phone]          Newborn Data: Live born female  Birth Weight: 7 lb 14.6 oz (3590 g) APGAR: 8, 9  Home with mother.  Dana Cole, CNM 02/06/2016, 7:46 AM

## 2016-02-06 NOTE — Plan of Care (Signed)
Problem: Education: Goal: Knowledge of condition will improve Discussed discharge instructions. Mother verbalized understanding of information.

## 2016-02-12 ENCOUNTER — Encounter: Payer: Medicaid Other | Admitting: Certified Nurse Midwife

## 2016-02-12 NOTE — Progress Notes (Signed)
CSW submitted infant's positive cord screen results for marijuana [facility], Bernie CoveyPam Miller.  Blaine HamperAngel Boyd-Gilyard, MSW, LCSW Clinical Social Work [phone]

## 2016-03-07 ENCOUNTER — Other Ambulatory Visit (HOSPITAL_COMMUNITY)
Admission: RE | Admit: 2016-03-07 | Discharge: 2016-03-07 | Disposition: A | Discharge status: Home / Self Care | Payer: 59 | Source: Ambulatory Visit | Attending: Certified Nurse Midwife | Admitting: Certified Nurse Midwife

## 2016-03-07 ENCOUNTER — Ambulatory Visit (INDEPENDENT_AMBULATORY_CARE_PROVIDER_SITE_OTHER): Payer: 59 | Admitting: Certified Nurse Midwife

## 2016-03-07 ENCOUNTER — Encounter: Payer: Self-pay | Admitting: Certified Nurse Midwife

## 2016-03-07 ENCOUNTER — Encounter: Payer: Self-pay | Admitting: *Deleted

## 2016-03-07 ENCOUNTER — Ambulatory Visit (INDEPENDENT_AMBULATORY_CARE_PROVIDER_SITE_OTHER): Payer: 59 | Admitting: Obstetrics and Gynecology

## 2016-03-07 VITALS — BP 143/100 | HR 79 | Wt 262.0 lb

## 2016-03-07 DIAGNOSIS — Z30013 Encounter for initial prescription of injectable contraceptive: Secondary | ICD-10-CM

## 2016-03-07 DIAGNOSIS — L02224 Furuncle of groin: Secondary | ICD-10-CM

## 2016-03-07 DIAGNOSIS — Z8619 Personal history of other infectious and parasitic diseases: Secondary | ICD-10-CM | POA: Insufficient documentation

## 2016-03-07 DIAGNOSIS — Z3009 Encounter for other general counseling and advice on contraception: Secondary | ICD-10-CM | POA: Insufficient documentation

## 2016-03-07 DIAGNOSIS — R030 Elevated blood-pressure reading, without diagnosis of hypertension: Secondary | ICD-10-CM

## 2016-03-07 MED ORDER — MUPIROCIN CALCIUM 2 % EX CREA: 1.0000 "application " | TOPICAL_CREAM | Freq: Two times a day (BID) | CUTANEOUS | 0 refills | Status: DC

## 2016-03-07 MED ORDER — AMOXICILLIN-POT CLAVULANATE 875-125 MG PO TABS: 1.0000 | ORAL_TABLET | Freq: Two times a day (BID) | ORAL | 0 refills | Status: DC

## 2016-03-07 MED ORDER — AMLODIPINE BESYLATE 5 MG PO TABS: 5.0000 mg | ORAL_TABLET | Freq: Every day | ORAL | 6 refills | Status: DC

## 2016-03-07 MED ORDER — MEDROXYPROGESTERONE ACETATE 150 MG/ML IM SUSP
150.0000 mg | Freq: Once | INTRAMUSCULAR | Status: AC
Start: 2016-03-07 — End: 2016-03-07
  Administered 2016-03-07: 150 mg via INTRAMUSCULAR

## 2016-03-07 NOTE — Progress Notes (Signed)
Please see Post partum visit note  Pt seen in consultant for lap Bs. Pt had a PP BTL in 2009  But become pregnant this past year She is 1 month out from TSVD. Due failure of tubal she desires salpingectomy.  PE   See PP note  A/P Undesired fertility  Lap BS reviewed with pt. R/B/Post op care reviewed. Pt verbalized understanding. Depo Provera today for contraception until surgery. Pt will be contact with surgery date and time.

## 2016-03-07 NOTE — Progress Notes (Signed)
Subjective:     Dana Cole is a 35 y.o. female who presents for a postpartum visit. She is 6 weeks postpartum following a spontaneous vaginal delivery. I have fully reviewed the prenatal and intrapartum course. The delivery was at 4948w3d gestational weeks. Outcome: spontaneous vaginal delivery. Anesthesia: none. Postpartum course has been normal. Baby's course has been normal. Baby is feeding by bottle - Similac Advance. Bleeding no bleeding. Bowel function is normal. Bladder function is normal. Patient is sexually active. Contraception method is condoms. Postpartum depression screening: negative.  Tobacco, alcohol and substance abuse history reviewed.  Adult immunizations reviewed including TDAP, rubella and varicella. Desires bilateral salpingectomy because she had a term pregnancy after delivery.   Reports boil: on perineum that ruptured two days ago.  Has a hx of HS.  The following portions of the patient's history were reviewed and updated as appropriate: allergies, current medications, past family history, past medical history, past social history, past surgical history and problem list. Blood pressure is elevated today.  Hx of Preeclampsia: no meds.  Review of Systems Pertinent items noted in HPI and remainder of comprehensive ROS otherwise negative.   Objective:    There were no vitals taken for this visit.  General:  alert, cooperative and no distress   Breasts:  inspection negative, no nipple discharge or bleeding, no masses or nodularity palpable  Lungs: clear to auscultation bilaterally  Heart:  regular rate and rhythm, S1, S2 normal, no murmur, click, rub or gallop  Abdomen: soft, non-tender; bowel sounds normal; no masses,  no organomegaly       Pelvic exam: deferred.    90% of 25 min visit spent on counseling and coordination of care.   Assessment:     Abnormal 6 week postpartum exam. Pap smear not done at today's visit.     H/O Gonorrhea   Mons pubis boil: Antibiotics  started   Elevated blood pressure: medications started  Plan:    1. Contraception: abstinence and condoms, 1 dose of Depo until surgery for birth control.  2. F/U with bilateral salpingectomy Dr. Rande LawmanErwin to discuss today. 3. Follow up in: 2 weeks or as needed for blood pressure check and pre-op appointment.  2hr GTT for h/o GDM/screening for DM q 3 yrs per ADA recommendations Preconception counseling provided Healthy lifestyle practices reviewed

## 2016-03-08 LAB — CBC WITH DIFFERENTIAL/PLATELET
BASOS ABS: 0 10*3/uL (ref 0.0–0.2)
Basos: 0 %
EOS (ABSOLUTE): 0.1 10*3/uL (ref 0.0–0.4)
EOS: 2 %
HEMATOCRIT: 40.1 % (ref 34.0–46.6)
Hemoglobin: 13.7 g/dL (ref 11.1–15.9)
IMMATURE GRANULOCYTES: 0 %
Immature Grans (Abs): 0 10*3/uL (ref 0.0–0.1)
Lymphocytes Absolute: 2.5 10*3/uL (ref 0.7–3.1)
Lymphs: 37 %
MCH: 29.8 pg (ref 26.6–33.0)
MCHC: 34.2 g/dL (ref 31.5–35.7)
MCV: 87 fL (ref 79–97)
MONOS ABS: 0.5 10*3/uL (ref 0.1–0.9)
Monocytes: 8 %
NEUTROS PCT: 53 %
Neutrophils Absolute: 3.6 10*3/uL (ref 1.4–7.0)
PLATELETS: 257 10*3/uL (ref 150–379)
RBC: 4.6 x10E6/uL (ref 3.77–5.28)
RDW: 13.6 % (ref 12.3–15.4)
WBC: 6.8 10*3/uL (ref 3.4–10.8)

## 2016-03-08 LAB — COMPREHENSIVE METABOLIC PANEL
ALT: 30 IU/L (ref 0–32)
AST: 25 IU/L (ref 0–40)
Albumin/Globulin Ratio: 1.2 (ref 1.2–2.2)
Albumin: 4.3 g/dL (ref 3.5–5.5)
Alkaline Phosphatase: 74 IU/L (ref 39–117)
BUN/Creatinine Ratio: 13 (ref 9–23)
BUN: 9 mg/dL (ref 6–20)
Bilirubin Total: 0.8 mg/dL (ref 0.0–1.2)
CALCIUM: 9.4 mg/dL (ref 8.7–10.2)
CO2: 21 mmol/L (ref 18–29)
CREATININE: 0.72 mg/dL (ref 0.57–1.00)
Chloride: 101 mmol/L (ref 96–106)
GFR calc Af Amer: 124 mL/min/{1.73_m2} (ref >59)
GFR, EST NON AFRICAN AMERICAN: 107 mL/min/{1.73_m2} (ref >59)
GLOBULIN, TOTAL: 3.5 g/dL (ref 1.5–4.5)
GLUCOSE: 84 mg/dL (ref 65–99)
Potassium: 4.7 mmol/L (ref 3.5–5.2)
Sodium: 143 mmol/L (ref 134–144)
Total Protein: 7.8 g/dL (ref 6.0–8.5)

## 2016-03-08 LAB — PROTEIN / CREATININE RATIO, URINE
CREATININE, UR: 106.5 mg/dL
PROTEIN UR: 27.2 mg/dL
PROTEIN/CREAT RATIO: 255 mg/g{creat} — AB (ref 0–200)

## 2016-03-08 LAB — LACTATE DEHYDROGENASE: LDH: 315 IU/L — AB (ref 119–226)

## 2016-03-08 LAB — CERVICOVAGINAL ANCILLARY ONLY
CHLAMYDIA, DNA PROBE: NEGATIVE
Neisseria Gonorrhea: NEGATIVE

## 2016-03-13 ENCOUNTER — Encounter (HOSPITAL_COMMUNITY): Payer: Self-pay | Admitting: *Deleted

## 2016-03-15 ENCOUNTER — Ambulatory Visit: Payer: 59 | Admitting: Certified Nurse Midwife

## 2016-04-08 ENCOUNTER — Encounter (HOSPITAL_COMMUNITY): Payer: Self-pay

## 2016-04-15 ENCOUNTER — Inpatient Hospital Stay (HOSPITAL_COMMUNITY)
Admission: RE | Admit: 2016-04-15 | Discharge: 2016-04-15 | Disposition: A | Discharge status: Home / Self Care | Payer: 59 | Source: Ambulatory Visit

## 2016-04-15 NOTE — Patient Instructions (Signed)
Your procedure is scheduled on:  Monday, Feb. 5, 2018  [facility] at:  11:30 AM  Pick up the phone at the desk and dial [phone].  Call this number if you have problems the morning of surgery: [phone].  Remember: Do NOT eat food:  After Midnight Sunday, Feb. 4, 2018  Do NOT drink clear liquids after:  9:00 AM day of surgery  Take these medicines the morning of surgery with a SIP OF WATER:  None  Stop ALL herbal medications at this time  Do NOT smoke the day of surgery.  Do NOT wear jewelry (body piercing), metal hair clips/bobby pins, make-up, or nail polish. Do NOT wear lotions, powders, or perfumes.  You may wear deodorant. Do NOT shave for 48 hours prior to surgery. Do NOT bring valuables to the hospital. Contacts, dentures, or bridgework may not be worn into surgery.  Have a responsible adult drive you home and stay with you for 24 hours after your procedure  Bring a copy of your healthcare power of attorney and living will documents.  **Effective Friday, Jan. 12, 2018, [facility] will implement no hospital visitations from children age 38 and younger due to a steady increase in flu activity in our community and hospitals. **

## 2016-04-22 ENCOUNTER — Ambulatory Visit (HOSPITAL_COMMUNITY): Admission: RE | Admit: 2016-04-22 | Payer: 59 | Source: Ambulatory Visit | Admitting: Obstetrics and Gynecology

## 2016-04-22 ENCOUNTER — Encounter (HOSPITAL_COMMUNITY): Admission: RE | Payer: Self-pay | Source: Ambulatory Visit

## 2016-04-22 SURGERY — SALPINGECTOMY, BILATERAL, LAPAROSCOPIC
Anesthesia: Choice | Site: Abdomen | Laterality: Bilateral

## 2016-05-06 ENCOUNTER — Ambulatory Visit (INDEPENDENT_AMBULATORY_CARE_PROVIDER_SITE_OTHER): Payer: 59 | Admitting: Obstetrics

## 2016-05-06 ENCOUNTER — Encounter: Payer: Self-pay | Admitting: Obstetrics

## 2016-05-06 ENCOUNTER — Other Ambulatory Visit (HOSPITAL_COMMUNITY)
Admission: RE | Admit: 2016-05-06 | Discharge: 2016-05-06 | Disposition: A | Discharge status: Home / Self Care | Payer: 59 | Source: Ambulatory Visit | Attending: Obstetrics | Admitting: Obstetrics

## 2016-05-06 VITALS — BP 138/85 | HR 76 | Wt 268.6 lb

## 2016-05-06 DIAGNOSIS — Z113 Encounter for screening for infections with a predominantly sexual mode of transmission: Secondary | ICD-10-CM

## 2016-05-06 DIAGNOSIS — B9689 Other specified bacterial agents as the cause of diseases classified elsewhere: Secondary | ICD-10-CM | POA: Diagnosis not present

## 2016-05-06 DIAGNOSIS — N898 Other specified noninflammatory disorders of vagina: Secondary | ICD-10-CM

## 2016-05-06 DIAGNOSIS — B3731 Acute candidiasis of vulva and vagina: Secondary | ICD-10-CM

## 2016-05-06 DIAGNOSIS — B373 Candidiasis of vulva and vagina: Secondary | ICD-10-CM | POA: Diagnosis not present

## 2016-05-06 DIAGNOSIS — N760 Acute vaginitis: Secondary | ICD-10-CM

## 2016-05-06 MED ORDER — TINIDAZOLE 500 MG PO TABS: 1000.0000 mg | ORAL_TABLET | Freq: Every day | ORAL | 2 refills | Status: DC

## 2016-05-06 MED ORDER — FLUCONAZOLE 150 MG PO TABS: 150.0000 mg | ORAL_TABLET | Freq: Once | ORAL | 0 refills | Status: AC

## 2016-05-06 NOTE — Progress Notes (Signed)
Patient ID: Dana Popperomekia N Haberman, female   DOB: [birth year], 36 y.o.   MRN: [id number]  Chief Complaint  Patient presents with  . Gynecologic Exam    HPI Dana Cole is a 36 y.o. female.  Vaginal discharge.  Denies odor but has some irritation. HPI  Past Medical History:  Diagnosis Date  . Chlamydia   . Leukocytosis 08/07/2015  . Obesity   . Trichomonas infection     Past Surgical History:  Procedure Laterality Date  . HIP SURGERY    . JOINT REPLACEMENT    . TUBAL LIGATION      Family History  Problem Relation Age of Onset  . Hypertension Other   . Diabetes Other     Social History Social History  Substance Use Topics  . Smoking status: Former Smoker    Types: Cigarettes    Quit date: 06/07/2015  . Smokeless tobacco: Former NeurosurgeonUser  . Alcohol use No     Comment: occassional     Allergies  Allergen Reactions  . Shellfish Allergy Itching, Swelling and Other (See Comments)    Reaction:  Facial swelling     Current Outpatient Prescriptions  Medication Sig Dispense Refill  . amLODipine (NORVASC) 5 MG tablet Take 1 tablet (5 mg total) by mouth daily. (Patient not taking: Reported on 04/09/2016) 30 tablet 6  . oxyCODONE-acetaminophen (PERCOCET/ROXICET) 5-325 MG tablet Take 1-2 tablets by mouth every 4 (four) hours as needed (for pain scale equal to or greater than 7.). (Patient not taking: Reported on 03/07/2016) 45 tablet 0  . senna-docusate (SENOKOT-S) 8.6-50 MG tablet Take 2 tablets by mouth 2 (two) times daily. (Patient not taking: Reported on 03/07/2016) 90 tablet 2   No current facility-administered medications for this visit.     Review of Systems Review of Systems Constitutional: negative for fatigue and weight loss Respiratory: negative for cough and wheezing Cardiovascular: negative for chest pain, fatigue and palpitations Gastrointestinal: negative for abdominal pain and change in bowel habits Genitourinary:positive for vaginal discharge Integument/breast:  negative for nipple discharge Musculoskeletal:negative for myalgias Neurological: negative for gait problems and tremors Behavioral/Psych: negative for abusive relationship, depression Endocrine: negative for temperature intolerance      Blood pressure 138/85, pulse 76, weight 268 lb 9.6 oz (121.8 kg), last menstrual period 04/23/2016, not currently breastfeeding.  Physical Exam Physical Exam General:   alert  Skin:   no rash or abnormalities  Lungs:   clear to auscultation bilaterally  Heart:   regular rate and rhythm, S1, S2 normal, no murmur, click, rub or gallop  Breasts:   normal without suspicious masses, skin or nipple changes or axillary nodes  Abdomen:  normal findings: no organomegaly, soft, non-tender and no hernia  Pelvis:  External genitalia: normal general appearance Urinary system: urethral meatus normal and bladder without fullness, nontender Vaginal: normal without tenderness, induration or masses.  Grey, thin vaginal discharge Cervix: normal appearance Adnexa: normal bimanual exam Uterus: anteverted and non-tender, normal size    50% of 15 min visit spent on counseling and coordination of care.    Data Reviewed Wet prep  Assessment     Vaginal discharge.  Probably BV.    Plan     Tindamax Rx   F/U in 6 months for pap and annual   No orders of the defined types were placed in this encounter.  No orders of the defined types were placed in this encounter.        Patient ID: Dana Popperomekia N Hruska, female  DOB: March 17, 1981, 35 y.o.   MRN: [id number] Patient ID: GURSIMRAN LITAKER, female   DOB: 1980/05/04, 36 y.o.   MRN: [id number]

## 2016-05-06 NOTE — Progress Notes (Signed)
Patient is in the office today for possible BV.

## 2016-05-07 LAB — CERVICOVAGINAL ANCILLARY ONLY
Bacterial vaginitis: NEGATIVE
Candida vaginitis: NEGATIVE
Chlamydia: NEGATIVE
NEISSERIA GONORRHEA: NEGATIVE
TRICH (WINDOWPATH): NEGATIVE

## 2016-05-28 ENCOUNTER — Ambulatory Visit: Payer: 59

## 2016-05-29 ENCOUNTER — Ambulatory Visit: Payer: 59

## 2016-08-25 ENCOUNTER — Emergency Department (HOSPITAL_COMMUNITY)
Admission: EM | Admit: 2016-08-25 | Discharge: 2016-08-25 | Disposition: A | Discharge status: Home / Self Care | Payer: 59 | Attending: Emergency Medicine | Admitting: Emergency Medicine

## 2016-08-25 ENCOUNTER — Encounter (HOSPITAL_COMMUNITY): Payer: Self-pay | Admitting: Emergency Medicine

## 2016-08-25 DIAGNOSIS — R519 Headache, unspecified: Secondary | ICD-10-CM

## 2016-08-25 DIAGNOSIS — Z87891 Personal history of nicotine dependence: Secondary | ICD-10-CM | POA: Insufficient documentation

## 2016-08-25 DIAGNOSIS — R51 Headache: Secondary | ICD-10-CM | POA: Insufficient documentation

## 2016-08-25 DIAGNOSIS — Z79899 Other long term (current) drug therapy: Secondary | ICD-10-CM | POA: Insufficient documentation

## 2016-08-25 LAB — POC URINE PREG, ED: Preg Test, Ur: NEGATIVE

## 2016-08-25 LAB — COMPREHENSIVE METABOLIC PANEL
ALT: 30 U/L (ref 14–54)
AST: 23 U/L (ref 15–41)
Albumin: 3.9 g/dL (ref 3.5–5.0)
Alkaline Phosphatase: 54 U/L (ref 38–126)
Anion gap: 9 (ref 5–15)
BILIRUBIN TOTAL: 1.1 mg/dL (ref 0.3–1.2)
BUN: 14 mg/dL (ref 6–20)
CALCIUM: 8.9 mg/dL (ref 8.9–10.3)
CO2: 27 mmol/L (ref 22–32)
CREATININE: 0.65 mg/dL (ref 0.44–1.00)
Chloride: 104 mmol/L (ref 101–111)
GFR calc Af Amer: >60 mL/min (ref >60)
Glucose, Bld: 95 mg/dL (ref 65–99)
Potassium: 3.6 mmol/L (ref 3.5–5.1)
Sodium: 140 mmol/L (ref 135–145)
Total Protein: 7.8 g/dL (ref 6.5–8.1)

## 2016-08-25 LAB — CBC
HCT: 39.8 % (ref 36.0–46.0)
Hemoglobin: 13.7 g/dL (ref 12.0–15.0)
MCH: 29.2 pg (ref 26.0–34.0)
MCHC: 34.4 g/dL (ref 30.0–36.0)
MCV: 84.9 fL (ref 78.0–100.0)
PLATELETS: 264 10*3/uL (ref 150–400)
RBC: 4.69 MIL/uL (ref 3.87–5.11)
RDW: 12.8 % (ref 11.5–15.5)
WBC: 6.5 10*3/uL (ref 4.0–10.5)

## 2016-08-25 LAB — URINALYSIS, ROUTINE W REFLEX MICROSCOPIC
BILIRUBIN URINE: NEGATIVE
Glucose, UA: NEGATIVE mg/dL
Ketones, ur: NEGATIVE mg/dL
Nitrite: NEGATIVE
PH: 5 (ref 5.0–8.0)
Protein, ur: 100 mg/dL — AB
SPECIFIC GRAVITY, URINE: 1.026 (ref 1.005–1.030)

## 2016-08-25 LAB — LIPASE, BLOOD: Lipase: 23 U/L (ref 11–51)

## 2016-08-25 MED ORDER — KETOROLAC TROMETHAMINE 30 MG/ML IJ SOLN
30.0000 mg | Freq: Once | INTRAMUSCULAR | Status: AC
Start: 2016-08-25 — End: 2016-08-25
  Administered 2016-08-25: 30 mg via INTRAVENOUS
  Filled 2016-08-25: qty 1

## 2016-08-25 MED ORDER — SODIUM CHLORIDE 0.9 % IV BOLUS (SEPSIS)
1000.0000 mL | Freq: Once | INTRAVENOUS | Status: AC
  Administered 2016-08-25: 1000 mL via INTRAVENOUS

## 2016-08-25 MED ORDER — METOCLOPRAMIDE HCL 5 MG/ML IJ SOLN
10.0000 mg | Freq: Once | INTRAMUSCULAR | Status: AC
Start: 2016-08-25 — End: 2016-08-25
  Administered 2016-08-25: 10 mg via INTRAVENOUS
  Filled 2016-08-25: qty 2

## 2016-08-25 MED ORDER — DEXAMETHASONE SODIUM PHOSPHATE 10 MG/ML IJ SOLN
10.0000 mg | Freq: Once | INTRAMUSCULAR | Status: AC
  Administered 2016-08-25: 10 mg via INTRAVENOUS
  Filled 2016-08-25: qty 1

## 2016-08-25 MED ORDER — DIPHENHYDRAMINE HCL 50 MG/ML IJ SOLN
25.0000 mg | Freq: Once | INTRAMUSCULAR | Status: AC
  Administered 2016-08-25: 25 mg via INTRAVENOUS
  Filled 2016-08-25: qty 1

## 2016-08-25 MED ORDER — ONDANSETRON 4 MG PO TBDP: 4.0000 mg | ORAL_TABLET | Freq: Once | ORAL | Status: DC | PRN

## 2016-08-25 NOTE — ED Triage Notes (Signed)
Pt from home with c/o headache and emesis that began today. Pt reports 5 episodes of emesis and is actively vomiting in triage. Pt denies diarrhea or urinary symptoms

## 2016-08-25 NOTE — ED Provider Notes (Signed)
[facility]-EMERGENCY DEPT Provider Note   CSN: [id number] Arrival date & time: 08/25/16  1655     History   Chief Complaint Chief Complaint  Patient presents with  . Headache  . Emesis    HPI LEMON Dana Cole is a 36 y.o. female.  HPI   36 year old obese female presenting complaining of headache. Patient report waking up this morning with gradual onset of frontal headache. She describes headache as a sharp throbbing sensation, persistent, with associate nausea and vomiting. States she has vomited multiple times of nonbloody nonbilious content. She feels lightheadedness. Light and sounds bother her. She denies any specific treatment tried aside from trying to sleep it off. She denies associated fever, chills, URI symptoms, visual changes, neck stiffness, chest pain, shortness of breath, abdominal pain, focal numbness or weakness, or rash. She does not normally have headaches. No report of thunderclap headache. She denies any specific triggers  Past Medical History:  Diagnosis Date  . Chlamydia   . Leukocytosis 08/07/2015  . Obesity   . Trichomonas infection     Patient Active Problem List   Diagnosis Date Noted  . Unwanted fertility 03/07/2016  . Gonorrhea affecting pregnancy in third trimester 01/15/2016  . Surgical history of tubal ligation 09/06/2015  . Leukocytosis 08/07/2015    Past Surgical History:  Procedure Laterality Date  . HIP SURGERY    . JOINT REPLACEMENT    . TUBAL LIGATION      OB History    Gravida Para Term Preterm AB Living   7 4 3 1 1 5    SAB TAB Ectopic Multiple Live Births   1       5      Obstetric Comments   Child #3 was 5 weeks preterm.        Home Medications    Prior to Admission medications   Medication Sig Start Date End Date Taking? Authorizing Provider  amLODipine (NORVASC) 5 MG tablet Take 1 tablet (5 mg total) by mouth daily. Patient not taking: Reported on 04/09/2016 03/07/16   Roe Coombs, CNM  oxyCODONE-acetaminophen  (PERCOCET/ROXICET) 5-325 MG tablet Take 1-2 tablets by mouth every 4 (four) hours as needed (for pain scale equal to or greater than 7.). Patient not taking: Reported on 03/07/2016 02/06/16   Orvilla Cornwall A, CNM  senna-docusate (SENOKOT-S) 8.6-50 MG tablet Take 2 tablets by mouth 2 (two) times daily. Patient not taking: Reported on 03/07/2016 02/06/16   Roe Coombs, CNM  tinidazole (TINDAMAX) 500 MG tablet Take 2 tablets (1,000 mg total) by mouth daily with breakfast. 05/06/16   Brock Bad, MD    Family History Family History  Problem Relation Age of Onset  . Hypertension Other   . Diabetes Other     Social History Social History  Substance Use Topics  . Smoking status: Former Smoker    Types: Cigarettes    Quit date: 06/07/2015  . Smokeless tobacco: Former Neurosurgeon  . Alcohol use No     Comment: occassional      Allergies   Shellfish allergy   Review of Systems Review of Systems  All other systems reviewed and are negative.    Physical Exam Updated Vital Signs BP (!) 150/93 (BP Location: Left Arm)   Pulse 87   Temp 98.9 F (37.2 C) (Oral)   Resp 18   Ht 5\' 9"  (1.753 m)   Wt 121.6 kg (268 lb)   SpO2 100%   BMI 39.58 kg/m   Physical Exam  Constitutional: She is oriented to person, place, and time. She appears well-developed and well-nourished. No distress.  HENT:  Head: Atraumatic.  Right Ear: External ear normal.  Left Ear: External ear normal.  Mouth/Throat: Oropharynx is clear and moist.  Eyes: Conjunctivae and EOM are normal. Pupils are equal, round, and reactive to light.  Neck: Normal range of motion. Neck supple.  No nuchal rigidity  Cardiovascular: Normal rate and regular rhythm.   Pulmonary/Chest: Effort normal and breath sounds normal.  Abdominal: Soft.  Neurological: She is alert and oriented to person, place, and time.  Neurologic exam:  Speech clear, pupils equal round reactive to light, extraocular movements intact  Normal  peripheral visual fields Cranial nerves III through XII normal including no facial droop Follows commands, moves all extremities x4, normal strength to bilateral upper and lower extremities at all major muscle groups including grip Sensation normal to light touch  Coordination intact, no limb ataxia, finger-nose-finger normal Rapid alternating movements normal No pronator drift Gait normal   Skin: No rash noted.  Psychiatric: She has a normal mood and affect.  Nursing note and vitals reviewed.    ED Treatments / Results  Labs (all labs ordered are listed, but only abnormal results are displayed) Labs Reviewed  URINALYSIS, ROUTINE W REFLEX MICROSCOPIC - Abnormal; Notable for the following:       Result Value   APPearance CLOUDY (*)    Hgb urine dipstick SMALL (*)    Protein, ur 100 (*)    Leukocytes, UA MODERATE (*)    Bacteria, UA RARE (*)    Squamous Epithelial / LPF 6-30 (*)    All other components within normal limits  LIPASE, BLOOD  COMPREHENSIVE METABOLIC PANEL  CBC  POC URINE PREG, ED    EKG  EKG Interpretation None       Radiology No results found.  Procedures Procedures (including critical care time)  Medications Ordered in ED Medications  sodium chloride 0.9 % bolus 1,000 mL (1,000 mLs Intravenous New Bag/Given 08/25/16 1748)  diphenhydrAMINE (BENADRYL) injection 25 mg (25 mg Intravenous Given 08/25/16 1748)  metoCLOPramide (REGLAN) injection 10 mg (10 mg Intravenous Given 08/25/16 1750)  dexamethasone (DECADRON) injection 10 mg (10 mg Intravenous Given 08/25/16 1751)  ketorolac (TORADOL) 30 MG/ML injection 30 mg (30 mg Intravenous Given 08/25/16 1752)     Initial Impression / Assessment and Plan / ED Course  I have reviewed the triage vital signs and the nursing notes.  Pertinent labs & imaging results that were available during my care of the patient were reviewed by me and considered in my medical decision making (see chart for details).     BP  (!) 150/93 (BP Location: Left Arm)   Pulse 87   Temp 98.9 F (37.2 C) (Oral)   Resp 18   Ht 5\' 9"  (1.753 m)   Wt 121.6 kg (268 lb)   SpO2 100%   BMI 39.58 kg/m    Final Clinical Impressions(s) / ED Diagnoses   Final diagnoses:  Bad headache    New Prescriptions New Prescriptions   No medications on file   Headache similar to previous, no fever, neck stiffness, neuro findings or new symptoms to suggest more serious etiology.  I don't think SAH, ICH, meningitis, encephalitis, mass at this time.  No recent trauma.  I don't feel imaging necessary at this time.  Plan to control symptoms.  7:06 PM Labs are reassuring. Patient felt much better after receiving migraine cocktail. She is stable for  discharge. Suspect sinus headache or tension headache. Return precaution discussed.   Fayrene Helper, PA-C 08/25/16 1906    Nira Conn, MD 08/26/16 [phone]

## 2018-07-14 IMAGING — US US MFM OB FOLLOW-UP
1 series · 13 of 28 positions shown · non-contrast
Comparison: none

[Series 1: us mfm ob follow-up · 13 of 30 slices shown]
[im 2/30]
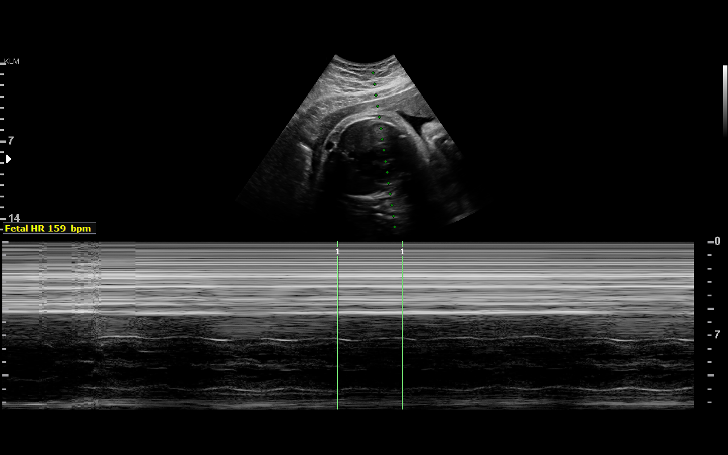
[im 4/30]
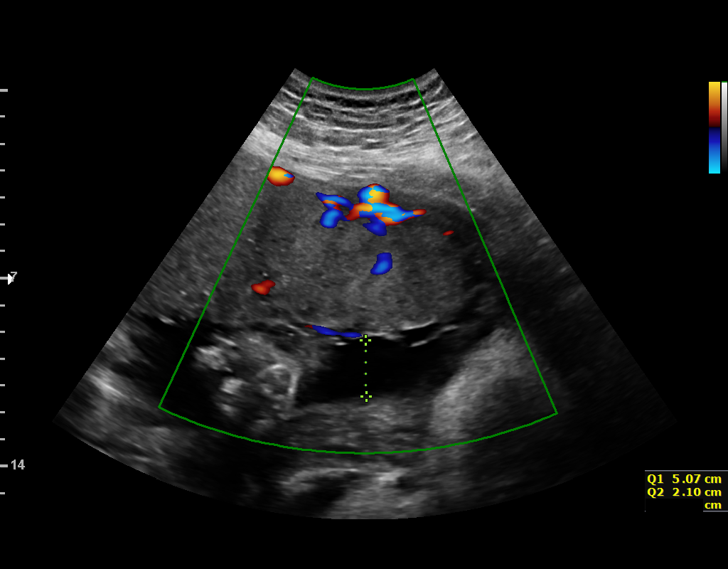
[im 6/30]
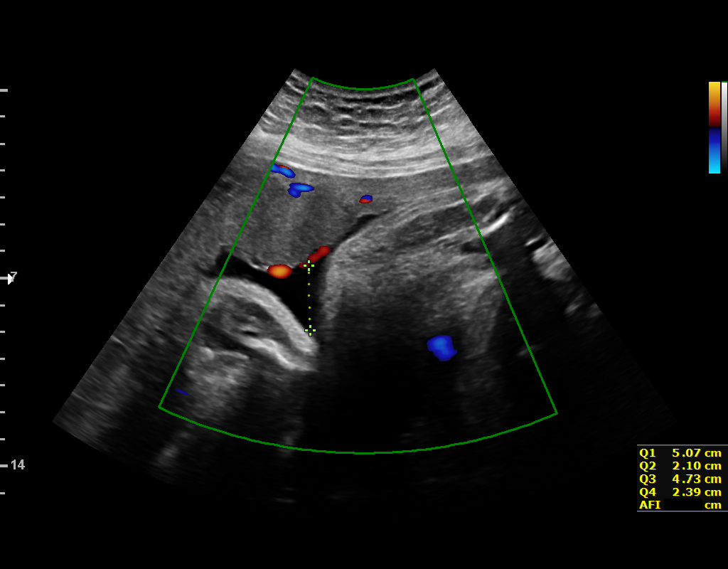
[im 8/30]
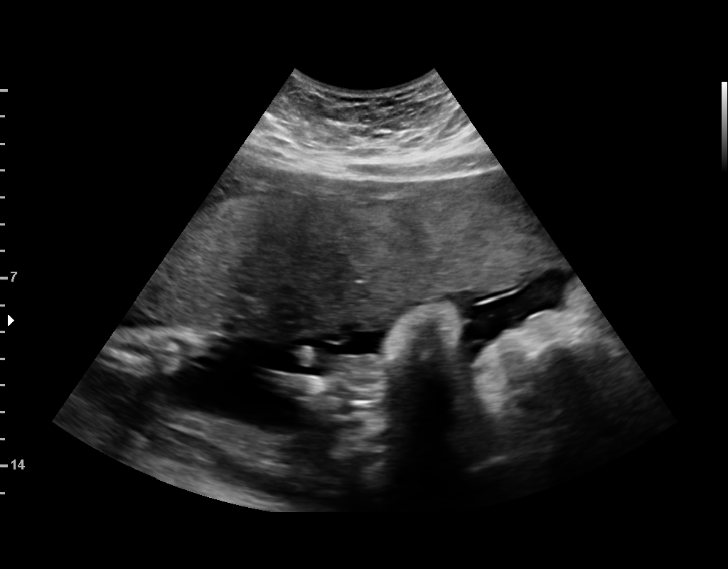
[im 10/30]
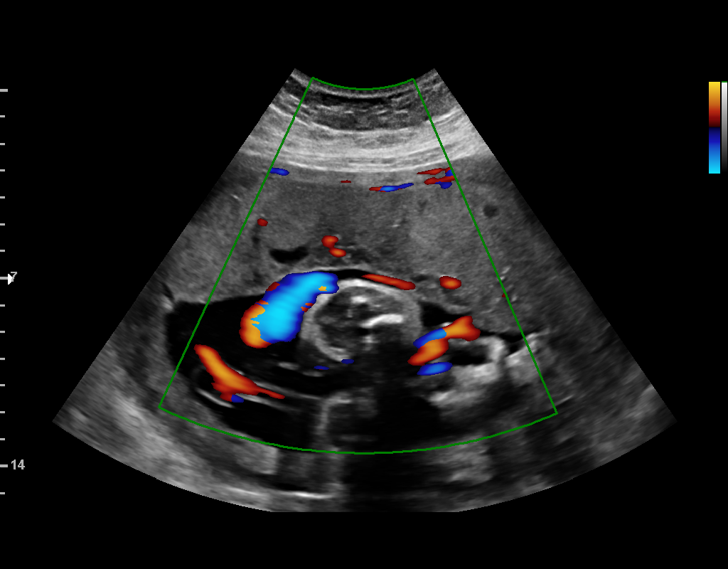
[im 12/30]
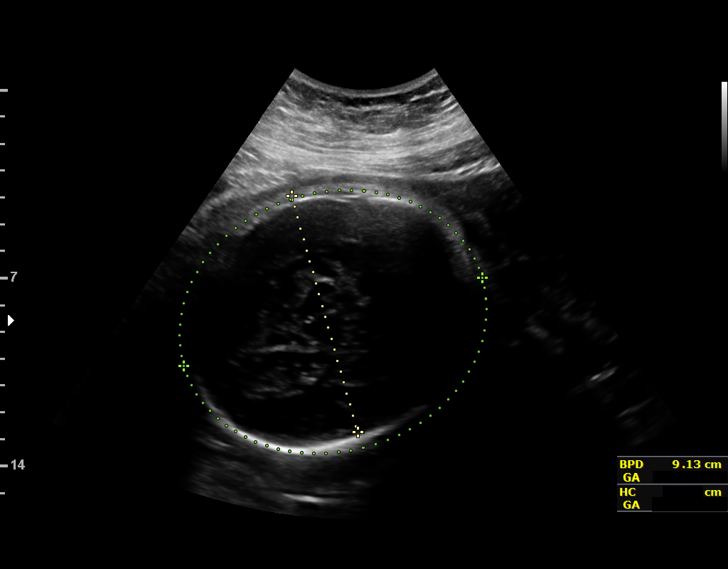
[im 16/30]
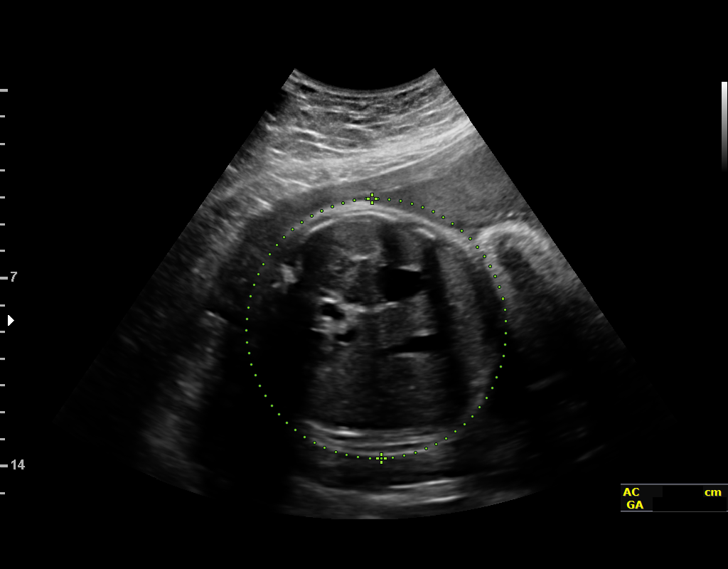
[im 18/30]
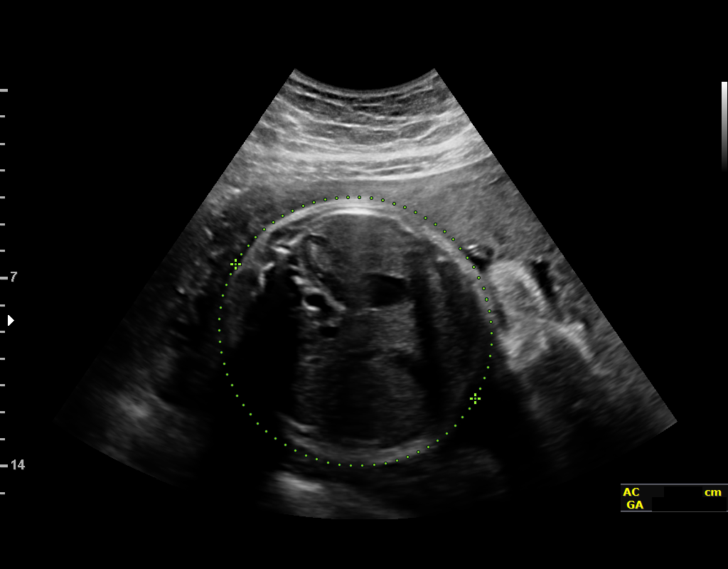
[im 20/30]
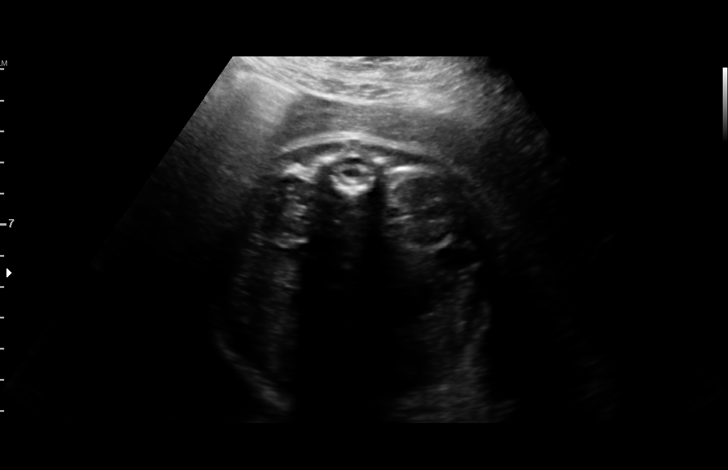
[im 22/30]
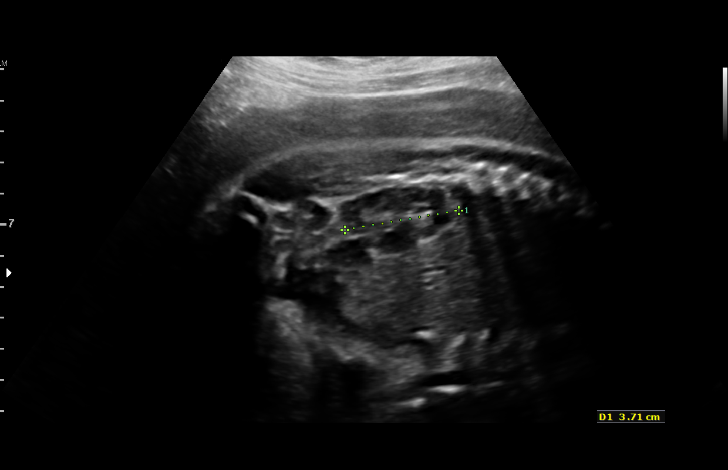
[im 24/30]
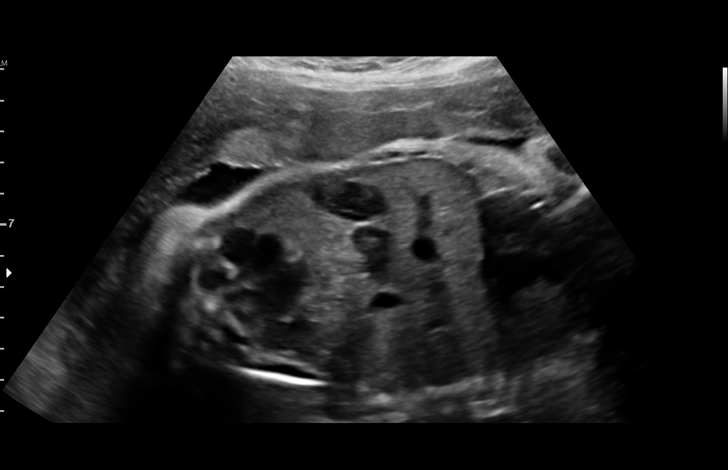
[im 26/30]
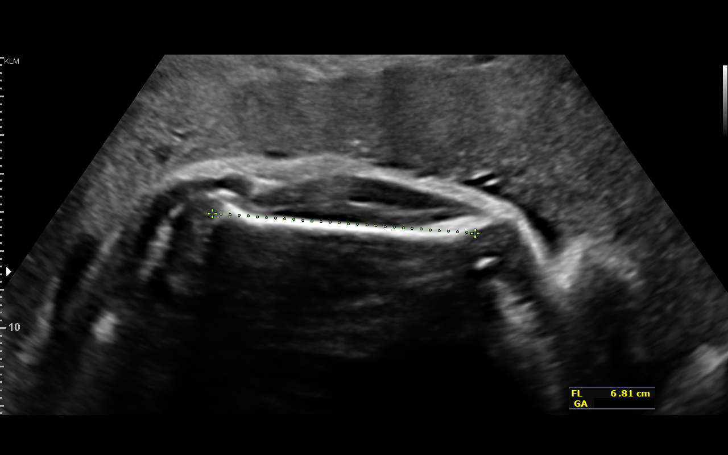
[im 28/30]
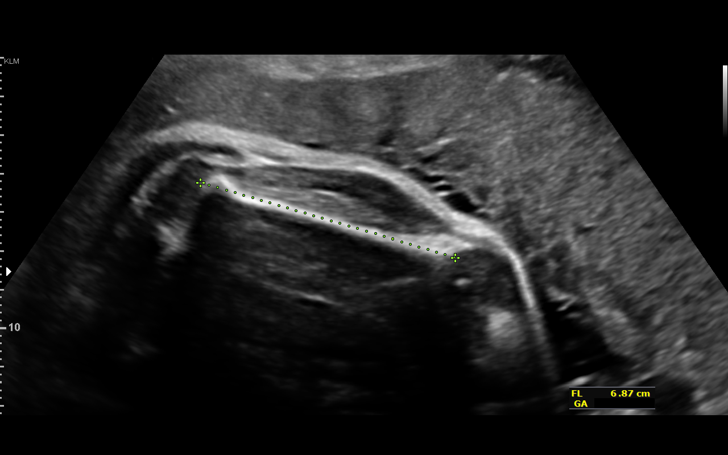

[13 of 28 positions shown; findings below may reference images not displayed]

am)

Road [HOSPITAL]

[address]            [id number]      [id number]     [id number]
Indications

33 weeks gestation of pregnancy
Advanced maternal age multigravida 35,
second trimester (low risk [id number])
Advanced paternal age
Obesity complicating pregnancy, second
trimester
OB History

Gravidity:    6         Term:   3        Prem:   1        SAB:   1
TOP:          0       Ectopic:  0        Living: 4
Fetal Evaluation

Num Of Fetuses:     1
Fetal Heart         159
Rate(bpm):
Cardiac Activity:   Observed
Presentation:       Cephalic
Placenta:           Anterior, above cervical os
P. Cord Insertion:  Visualized

Amniotic Fluid
AFI FV:      Subjectively within normal limits

AFI Sum(cm)     %Tile       Largest Pocket(cm)
14.29           50

RUQ(cm)       RLQ(cm)       LUQ(cm)        LLQ(cm)
5.07
Biometry

BPD:      92.1  mm     G. Age:  37w 3d       > 99  %    CI:        77.16   %   70 - 86
FL/HC:      20.5   %   19.4 -
HC:       332   mm     G. Age:  37w 6d         97  %    HC/AC:      1.06       0.96 -
AC:      312.6  mm     G. Age:  35w 1d         88  %    FL/BPD:     73.9   %   71 - 87
FL:       68.1  mm     G. Age:  35w 0d         73  %    FL/AC:      21.8   %   20 - 24

Est. FW:    9540  gm          6 lb      86  %
Gestational Age

LMP:           33w 1d       Date:   05/22/15                 EDD:   02/26/16
U/S Today:     36w 3d                                        EDD:   02/03/16
Best:          33w 5d    Det. By:   Early Ultrasound         EDD:   02/22/16
(07/03/15)
Anatomy

Cranium:               Appears normal         Aortic Arch:            Previously seen
Cavum:                 Previously seen        Ductal Arch:            Previously seen
Ventricles:            Appears normal         Diaphragm:              Appears normal
Choroid Plexus:        Previously seen        Stomach:                Appears normal, left
sided
Cerebellum:            Appears normal         Abdomen:                Appears normal
Posterior Fossa:       Appears normal         Abdominal Wall:         Previously seen
Nuchal Fold:           Not applicable (>20    Cord Vessels:           Previously seen
wks GA)
Face:                  Orbits and profile     Kidneys:                Appear normal
previously seen
Lips:                  Previously seen        Bladder:                Appears normal
Thoracic:              Appears normal         Spine:                  Previously seen
Heart:                 Appears normal         Upper Extremities:      Previously seen
(4CH, axis, and situs
RVOT:                  Previously seen        Lower Extremities:      Previously seen
LVOT:                  Previously seen

Other:  . Male gender. Technically difficult due to maternal habitus and fetal
position.
Cervix Uterus Adnexa

Cervix
Not visualized (advanced GA >97wks)
Impression

Singleton intrauterine pregnancy at 33+5 AMA and LGA baby
Review of the anatomy shows no sonographic markers for
aneuploidy or structural anomalies
Amniotic fluid volume is normal
Estimated fetal weight is 2731g which is growth in the 86th
percentile
Recommendations

Repeat US for growth evaluation in 4 weeks

## 2020-08-31 ENCOUNTER — Other Ambulatory Visit: Payer: Self-pay | Admitting: Nurse Practitioner

## 2020-08-31 DIAGNOSIS — Z1231 Encounter for screening mammogram for malignant neoplasm of breast: Secondary | ICD-10-CM

## 2020-10-26 ENCOUNTER — Ambulatory Visit: Payer: 59

## 2022-08-03 ENCOUNTER — Emergency Department (HOSPITAL_COMMUNITY)
Admission: EM | Admit: 2022-08-03 | Discharge: 2022-08-03 | Disposition: A | Discharge status: Home / Self Care | Payer: Medicaid Other | Attending: Emergency Medicine | Admitting: Emergency Medicine

## 2022-08-03 ENCOUNTER — Other Ambulatory Visit: Payer: Self-pay

## 2022-08-03 ENCOUNTER — Encounter (HOSPITAL_COMMUNITY): Payer: Self-pay

## 2022-08-03 DIAGNOSIS — Z1152 Encounter for screening for COVID-19: Secondary | ICD-10-CM | POA: Insufficient documentation

## 2022-08-03 DIAGNOSIS — J020 Streptococcal pharyngitis: Secondary | ICD-10-CM | POA: Insufficient documentation

## 2022-08-03 DIAGNOSIS — H6690 Otitis media, unspecified, unspecified ear: Secondary | ICD-10-CM

## 2022-08-03 DIAGNOSIS — J029 Acute pharyngitis, unspecified: Secondary | ICD-10-CM | POA: Diagnosis present

## 2022-08-03 DIAGNOSIS — H6691 Otitis media, unspecified, right ear: Secondary | ICD-10-CM | POA: Diagnosis not present

## 2022-08-03 LAB — RESP PANEL BY RT-PCR (RSV, FLU A&B, COVID)  RVPGX2
Influenza A by PCR: NEGATIVE
Influenza B by PCR: NEGATIVE
Resp Syncytial Virus by PCR: NEGATIVE
SARS Coronavirus 2 by RT PCR: NEGATIVE

## 2022-08-03 LAB — GROUP A STREP BY PCR: Group A Strep by PCR: DETECTED — AB

## 2022-08-03 MED ORDER — ACETAMINOPHEN 325 MG PO TABS
650.0000 mg | ORAL_TABLET | Freq: Once | ORAL | Status: AC
  Administered 2022-08-03: 650 mg via ORAL
  Filled 2022-08-03: qty 2

## 2022-08-03 MED ORDER — LIDOCAINE VISCOUS HCL 2 % MT SOLN
15.0000 mL | Freq: Once | OROMUCOSAL | Status: AC
  Administered 2022-08-03: 15 mL via OROMUCOSAL
  Filled 2022-08-03: qty 15

## 2022-08-03 MED ORDER — ONDANSETRON 4 MG PO TBDP
4.0000 mg | ORAL_TABLET | Freq: Once | ORAL | Status: AC
  Administered 2022-08-03: 4 mg via ORAL
  Filled 2022-08-03: qty 1

## 2022-08-03 MED ORDER — AMOXICILLIN 875 MG PO TABS: 875.0000 mg | ORAL_TABLET | Freq: Two times a day (BID) | ORAL | 0 refills | Status: AC

## 2022-08-03 NOTE — ED Triage Notes (Signed)
Pt c/o right ear pain started yesterday. Pt states she had throat pain two days ago then the throat pain turned into right ear pain yesterday. Pt states she vomited yesterday. Pt states she vomited three times yesterday

## 2022-08-03 NOTE — Discharge Instructions (Addendum)
Tested positive for strep throat for which we will treat with a course of amoxicillin.  This will also treat your ear infection. Recommend Tylenol and ibuprofen for pain and fever control, continue to push oral fluids.

## 2022-08-03 NOTE — ED Provider Notes (Signed)
[place] EMERGENCY DEPARTMENT AT [facility] Provider Note   CSN: [id number] Arrival date & time: 08/03/22  0749     History  Chief Complaint  Patient presents with   Otalgia   Emesis    Dana Cole is a 42 y.o. female.   Otalgia Associated symptoms: vomiting   Emesis    77 female with medical history significant for obesity who presents to the emergency department with right-sided otalgia and a sore throat.  Symptoms have been ongoing since this past Thursday.  She endorses worsening pain in her right ear that radiates to her neck.  She had 3 episodes of NBNB emesis yesterday.  She also endorses mild sore throat.  Denies any neck rigidity, no fevers. Good ROM of the neck. Tolerating oral intake.   Home Medications Prior to Admission medications   Medication Sig Start Date End Date Taking? Authorizing Provider  amoxicillin (AMOXIL) 875 MG tablet Take 1 tablet (875 mg total) by mouth 2 (two) times daily for 10 days. 08/03/22 08/13/22 Yes Ernie Avena, MD  amLODipine (NORVASC) 5 MG tablet Take 1 tablet (5 mg total) by mouth daily. Patient not taking: Reported on 04/09/2016 03/07/16   Roe Coombs, CNM  oxyCODONE-acetaminophen (PERCOCET/ROXICET) 5-325 MG tablet Take 1-2 tablets by mouth every 4 (four) hours as needed (for pain scale equal to or greater than 7.). Patient not taking: Reported on 03/07/2016 02/06/16   Orvilla Cornwall A, CNM  senna-docusate (SENOKOT-S) 8.6-50 MG tablet Take 2 tablets by mouth 2 (two) times daily. Patient not taking: Reported on 03/07/2016 02/06/16   Roe Coombs, CNM  tinidazole (TINDAMAX) 500 MG tablet Take 2 tablets (1,000 mg total) by mouth daily with breakfast. Patient not taking: Reported on 08/25/2016 05/06/16   Brock Bad, MD      Allergies    Shellfish allergy    Review of Systems   Review of Systems  HENT:  Positive for ear pain.   Gastrointestinal:  Positive for nausea and vomiting.  All other systems  reviewed and are negative.   Physical Exam Updated Vital Signs BP (!) 147/81   Pulse 83   Temp 98.2 F (36.8 C) (Oral)   Resp 16   Ht 5\' 9"  (1.753 m)   Wt 121.6 kg   SpO2 99%   BMI 39.59 kg/m  Physical Exam Vitals and nursing note reviewed.  Constitutional:      General: She is not in acute distress.    Appearance: She is well-developed.  HENT:     Head: Normocephalic and atraumatic.     Right Ear: Tympanic membrane is erythematous and bulging.     Left Ear: Tympanic membrane, ear canal and external ear normal.     Mouth/Throat:     Pharynx: Posterior oropharyngeal erythema present.     Tonsils: Tonsillar exudate present. No tonsillar abscesses.  Eyes:     Conjunctiva/sclera: Conjunctivae normal.  Cardiovascular:     Rate and Rhythm: Normal rate and regular rhythm.     Heart sounds: No murmur heard. Pulmonary:     Effort: Pulmonary effort is normal. No respiratory distress.     Breath sounds: Normal breath sounds.  Abdominal:     Palpations: Abdomen is soft.     Tenderness: There is no abdominal tenderness.  Musculoskeletal:        General: No swelling.     Cervical back: Neck supple.  Skin:    General: Skin is warm and dry.  Capillary Refill: Capillary refill takes less than 2 seconds.  Neurological:     Mental Status: She is alert.  Psychiatric:        Mood and Affect: Mood normal.     ED Results / Procedures / Treatments   Labs (all labs ordered are listed, but only abnormal results are displayed) Labs Reviewed  GROUP A STREP BY PCR - Abnormal; Notable for the following components:      Result Value   Group A Strep by PCR DETECTED (*)    All other components within normal limits  RESP PANEL BY RT-PCR (RSV, FLU A&B, COVID)  [id number]    EKG None  Radiology No results found.  Procedures Procedures    Medications Ordered in ED Medications  acetaminophen (TYLENOL) tablet 650 mg (650 mg Oral Given 08/03/22 0821)  ondansetron (ZOFRAN-ODT)  disintegrating tablet 4 mg (4 mg Oral Given 08/03/22 0846)  lidocaine (XYLOCAINE) 2 % viscous mouth solution 15 mL (15 mLs Mouth/Throat Given 08/03/22 0846)    ED Course/ Medical Decision Making/ A&P Clinical Course as of 08/03/22 1610  Sat Aug 03, 2022  0913 Group A Strep by PCR(!): DETECTED [JL]    Clinical Course User Index [JL] Ernie Avena, MD                             Medical Decision Making Risk OTC drugs. Prescription drug management.    67 female with medical history significant for obesity who presents to the emergency department with right-sided otalgia and a sore throat.  Symptoms have been ongoing since this past Thursday.  She endorses worsening pain in her right ear that radiates to her neck.  She had 3 episodes of NBNB emesis yesterday.  She also endorses mild sore throat.  Denies any neck rigidity, no fevers. Good ROM of the neck. Tolerating oral intake.   On arrival, the patient was afebrile, not tachycardic or tachypneic, hemodynamically stable, saturating well on room air.  Physical exam significant for a bulging and erythematous TM on the right consistent with otitis media.  Mild oropharyngeal erythema with minimal exudate present.  Differential diagnosis includes viral URI, strep pharyngitis, other viral infection such as COVID-19, less likely influenza, considered bacterial otitis media.  Patient without tongue elevation or tenderness submandibularly, low concern for Ludwig's Angina, low concern for peritonsillar abscess.  Patient tolerating oral intake and overall well-appearing, vitally stable.  Strep PCR testing was positive for group A strep, COVID-19 and influenza PCR testing was negative.  Will treat the patient with a 10-day course of antibiotics.  Advised Tylenol and ibuprofen for pain and fever control, continued oral fluid resuscitation, return precautions provided in the event of any worsening of symptoms.  Stable for discharge.   Final Clinical  Impression(s) / ED Diagnoses Final diagnoses:  Strep pharyngitis  Acute otitis media, unspecified otitis media type    Rx / DC Orders ED Discharge Orders          Ordered    amoxicillin (AMOXIL) 875 MG tablet  2 times daily        08/03/22 0929              Ernie Avena, MD 08/03/22 0930

## 2023-04-30 NOTE — Progress Notes (Signed)
Sent message, via epic in basket, requesting orders in epic from [facility].

## 2023-05-02 NOTE — Progress Notes (Signed)
Second request for pre op orders in [facility]: Left voicemail for [employer].

## 2023-05-05 ENCOUNTER — Ambulatory Visit: Payer: Self-pay | Admitting: Emergency Medicine

## 2023-05-05 DIAGNOSIS — G8929 Other chronic pain: Secondary | ICD-10-CM

## 2023-05-05 NOTE — H&P (View-Only) (Signed)
 TOTAL HIP ADMISSION H&P  Patient is admitted for right total hip arthroplasty.  Subjective:  Chief Complaint: right hip pain  HPI: Dana Cole, 43 y.o. female, has a history of pain and functional disability in the right hip(s) due to arthritis and patient has failed non-surgical conservative treatments for greater than 12 weeks to include NSAID's and/or analgesics, corticosteriod injections, and activity modification.  Onset of symptoms was gradual starting 6 years ago with gradually worsening course since that time.The patient noted prior procedures of the hip to include SCFE screw placement  on the right hip(s).  Patient currently rates pain in the right hip at 10 out of 10 with activity. Patient has night pain, worsening of pain with activity and weight bearing, pain that interfers with activities of daily living, and pain with passive range of motion. Patient has evidence of periarticular osteophytes and joint space narrowing by imaging studies. This condition presents safety issues increasing the risk of falls.  There is no current active infection.  Patient Active Problem List   Diagnosis Date Noted   Unwanted fertility 03/07/2016   Gonorrhea affecting pregnancy in third trimester 01/15/2016   Surgical history of tubal ligation 09/06/2015   Leukocytosis 08/07/2015   Past Medical History:  Diagnosis Date   Chlamydia    Leukocytosis 08/07/2015   Obesity    Trichomonas infection     Past Surgical History:  Procedure Laterality Date   HIP SURGERY     JOINT REPLACEMENT     TUBAL LIGATION      Current Outpatient Medications  Medication Sig Dispense Refill Last Dose/Taking   Aspirin-Acetaminophen-Caffeine (GOODY HEADACHE PO) Take 1 Package by mouth daily as needed.      traMADol (ULTRAM) 50 MG tablet Take 50 mg by mouth daily.      No current facility-administered medications for this visit.   Allergies  Allergen Reactions   Shellfish Allergy Itching, Swelling and Other (See  Comments)    Reaction:  Facial swelling     Social History   Tobacco Use   Smoking status: Former    Current packs/day: 0.00    Types: Cigarettes    Quit date: 06/07/2015    Years since quitting: 7.9   Smokeless tobacco: Former  Substance Use Topics   Alcohol use: Yes    Comment: occassional     Family History  Problem Relation Age of Onset   Hypertension Other    Diabetes Other      Review of Systems  Musculoskeletal:  Positive for arthralgias.  All other systems reviewed and are negative.   Objective:  Physical Exam Constitutional:      General: She is not in acute distress.    Appearance: Normal appearance. She is not ill-appearing.  HENT:     Head: Normocephalic and atraumatic.     Right Ear: External ear normal.     Left Ear: External ear normal.     Nose: Nose normal.     Mouth/Throat:     Mouth: Mucous membranes are moist.     Pharynx: Oropharynx is clear.  Eyes:     Extraocular Movements: Extraocular movements intact.     Conjunctiva/sclera: Conjunctivae normal.  Cardiovascular:     Rate and Rhythm: Normal rate and regular rhythm.     Pulses: Normal pulses.     Heart sounds: Normal heart sounds.  Pulmonary:     Effort: Pulmonary effort is normal.     Breath sounds: Normal breath sounds.  Abdominal:  General: Bowel sounds are normal.     Palpations: Abdomen is soft.     Tenderness: There is no abdominal tenderness.  Musculoskeletal:        General: Tenderness present.     Cervical back: Normal range of motion and neck supple.     Comments: TTP over groin, lateral aspect, greater trochanter.  No significant swelling.  No overlying lesions of area of chief complaint.  Decreased strength and ROM due to elicited pain.  Dorsiflexion and plantarflexion intact.  BLE appear grossly neurovascularly intact.  Gait mildly antalgic.   Skin:    General: Skin is warm and dry.  Neurological:     Mental Status: She is alert and oriented to person, place, and  time. Mental status is at baseline.  Psychiatric:        Mood and Affect: Mood normal.        Behavior: Behavior normal.     Vital signs in last 24 hours: @VSRANGES @  Labs:   Estimated body mass index is 39.59 kg/m as calculated from the following:   Height as of 08/03/22: 5\' 9"  (1.753 m).   Weight as of 08/03/22: 121.6 kg.   Imaging Review Plain radiographs demonstrate severe degenerative joint disease of the right hip(s). The bone quality appears to be fair for age and reported activity level.      Assessment/Plan:  End stage arthritis, right hip(s)  The patient history, physical examination, clinical judgement of the provider and imaging studies are consistent with end stage degenerative joint disease of the right hip(s) and total hip arthroplasty is deemed medically necessary. The treatment options including medical management, injection therapy, arthroscopy and arthroplasty were discussed at length. The risks and benefits of total hip arthroplasty were presented and reviewed. The risks due to aseptic loosening, infection, stiffness, dislocation/subluxation,  thromboembolic complications and other imponderables were discussed.  The patient acknowledged the explanation, agreed to proceed with the plan and consent was signed. Patient is being admitted for inpatient treatment for surgery, pain control, PT, OT, prophylactic antibiotics, VTE prophylaxis, progressive ambulation and ADL's and discharge planning.The patient is planning to be discharged home with outpatient PT.    Patient's anticipated LOS is less than 2 midnights, meeting these requirements: - Younger than 94 - Lives within 1 hour of care - Has a competent adult at home to recover with post-op recover - NO history of  - Chronic pain requiring opiods  - Diabetes  - Coronary Artery Disease  - Heart failure  - Heart attack  - Stroke  - Cardiac arrhythmia  - Respiratory Failure/COPD  - Renal failure  -  Anemia  - Advanced Liver disease

## 2023-05-05 NOTE — H&P (Signed)
TOTAL HIP ADMISSION H&P  Patient is admitted for right total hip arthroplasty.  Subjective:  Chief Complaint: right hip pain  HPI: Dana Cole, 43 y.o. female, has a history of pain and functional disability in the right hip(s) due to arthritis and patient has failed non-surgical conservative treatments for greater than 12 weeks to include NSAID's and/or analgesics, corticosteriod injections, and activity modification.  Onset of symptoms was gradual starting 6 years ago with gradually worsening course since that time.The patient noted prior procedures of the hip to include SCFE screw placement  on the right hip(s).  Patient currently rates pain in the right hip at 10 out of 10 with activity. Patient has night pain, worsening of pain with activity and weight bearing, pain that interfers with activities of daily living, and pain with passive range of motion. Patient has evidence of periarticular osteophytes and joint space narrowing by imaging studies. This condition presents safety issues increasing the risk of falls.  There is no current active infection.  Patient Active Problem List   Diagnosis Date Noted   Unwanted fertility 03/07/2016   Gonorrhea affecting pregnancy in third trimester 01/15/2016   Surgical history of tubal ligation 09/06/2015   Leukocytosis 08/07/2015   Past Medical History:  Diagnosis Date   Chlamydia    Leukocytosis 08/07/2015   Obesity    Trichomonas infection     Past Surgical History:  Procedure Laterality Date   HIP SURGERY     JOINT REPLACEMENT     TUBAL LIGATION      Current Outpatient Medications  Medication Sig Dispense Refill Last Dose/Taking   Aspirin-Acetaminophen-Caffeine (GOODY HEADACHE PO) Take 1 Package by mouth daily as needed.      traMADol (ULTRAM) 50 MG tablet Take 50 mg by mouth daily.      No current facility-administered medications for this visit.   Allergies  Allergen Reactions   Shellfish Allergy Itching, Swelling and Other (See  Comments)    Reaction:  Facial swelling     Social History   Tobacco Use   Smoking status: Former    Current packs/day: 0.00    Types: Cigarettes    Quit date: 06/07/2015    Years since quitting: 7.9   Smokeless tobacco: Former  Substance Use Topics   Alcohol use: Yes    Comment: occassional     Family History  Problem Relation Age of Onset   Hypertension Other    Diabetes Other      Review of Systems  Musculoskeletal:  Positive for arthralgias.  All other systems reviewed and are negative.   Objective:  Physical Exam Constitutional:      General: She is not in acute distress.    Appearance: Normal appearance. She is not ill-appearing.  HENT:     Head: Normocephalic and atraumatic.     Right Ear: External ear normal.     Left Ear: External ear normal.     Nose: Nose normal.     Mouth/Throat:     Mouth: Mucous membranes are moist.     Pharynx: Oropharynx is clear.  Eyes:     Extraocular Movements: Extraocular movements intact.     Conjunctiva/sclera: Conjunctivae normal.  Cardiovascular:     Rate and Rhythm: Normal rate and regular rhythm.     Pulses: Normal pulses.     Heart sounds: Normal heart sounds.  Pulmonary:     Effort: Pulmonary effort is normal.     Breath sounds: Normal breath sounds.  Abdominal:  General: Bowel sounds are normal.     Palpations: Abdomen is soft.     Tenderness: There is no abdominal tenderness.  Musculoskeletal:        General: Tenderness present.     Cervical back: Normal range of motion and neck supple.     Comments: TTP over groin, lateral aspect, greater trochanter.  No significant swelling.  No overlying lesions of area of chief complaint.  Decreased strength and ROM due to elicited pain.  Dorsiflexion and plantarflexion intact.  BLE appear grossly neurovascularly intact.  Gait mildly antalgic.   Skin:    General: Skin is warm and dry.  Neurological:     Mental Status: She is alert and oriented to person, place, and  time. Mental status is at baseline.  Psychiatric:        Mood and Affect: Mood normal.        Behavior: Behavior normal.     Vital signs in last 24 hours: @VSRANGES @  Labs:   Estimated body mass index is 39.59 kg/m as calculated from the following:   Height as of 08/03/22: 5\' 9"  (1.753 m).   Weight as of 08/03/22: 121.6 kg.   Imaging Review Plain radiographs demonstrate severe degenerative joint disease of the right hip(s). The bone quality appears to be fair for age and reported activity level.      Assessment/Plan:  End stage arthritis, right hip(s)  The patient history, physical examination, clinical judgement of the provider and imaging studies are consistent with end stage degenerative joint disease of the right hip(s) and total hip arthroplasty is deemed medically necessary. The treatment options including medical management, injection therapy, arthroscopy and arthroplasty were discussed at length. The risks and benefits of total hip arthroplasty were presented and reviewed. The risks due to aseptic loosening, infection, stiffness, dislocation/subluxation,  thromboembolic complications and other imponderables were discussed.  The patient acknowledged the explanation, agreed to proceed with the plan and consent was signed. Patient is being admitted for inpatient treatment for surgery, pain control, PT, OT, prophylactic antibiotics, VTE prophylaxis, progressive ambulation and ADL's and discharge planning.The patient is planning to be discharged home with outpatient PT.    Patient's anticipated LOS is less than 2 midnights, meeting these requirements: - Younger than 94 - Lives within 1 hour of care - Has a competent adult at home to recover with post-op recover - NO history of  - Chronic pain requiring opiods  - Diabetes  - Coronary Artery Disease  - Heart failure  - Heart attack  - Stroke  - Cardiac arrhythmia  - Respiratory Failure/COPD  - Renal failure  -  Anemia  - Advanced Liver disease

## 2023-05-05 NOTE — Patient Instructions (Signed)
DUE TO COVID-19 ONLY TWO VISITORS  (aged 45 and older)  ARE ALLOWED TO COME WITH YOU AND STAY IN THE WAITING ROOM ONLY DURING PRE OP AND PROCEDURE.   **NO VISITORS ARE ALLOWED IN THE SHORT STAY AREA OR RECOVERY ROOM!!**  IF YOU WILL BE ADMITTED INTO THE HOSPITAL YOU ARE ALLOWED ONLY FOUR SUPPORT PEOPLE DURING VISITATION HOURS ONLY (7 AM -8PM)   The support person(s) must pass our screening, gel in and out, and wear a mask at all times, including in the patient's room. Patients must also wear a mask when staff or their support person are in the room. Visitors GUEST BADGE MUST BE WORN VISIBLY  One adult visitor may remain with you overnight and MUST be in the room by 8 P.M.     Your procedure is scheduled on: 05/19/23   Report to [facility] Main Entrance    Report to admitting at : 7:25 AM   Call this number if you have problems the morning of surgery [phone]   Do not eat food :After Midnight.   After Midnight you may have the following liquids until : 6:50 AM DAY OF SURGERY  Water Black Coffee (sugar ok, NO MILK/CREAM OR CREAMERS)  Tea (sugar ok, NO MILK/CREAM OR CREAMERS) regular and decaf                             Plain Jell-O (NO RED)                                           Fruit ices (not with fruit pulp, NO RED)                                     Popsicles (NO RED)                                                                  Juice: apple, WHITE grape, WHITE cranberry Sports drinks like Gatorade (NO RED)   The day of surgery:  Drink ONE (1) Pre-Surgery Clear Ensure or G2 at AM the morning of surgery. Drink in one sitting. Do not sip.  This drink was given to you during your hospital  pre-op appointment visit. Nothing else to drink after completing the  Pre-Surgery Clear Ensure or G2.          If you have questions, please contact your surgeon's office.  FOLLOW ANY ADDITIONAL PRE OP INSTRUCTIONS YOU RECEIVED FROM YOUR SURGEON'S OFFICE!!!   Oral  Hygiene is also important to reduce your risk of infection.                                    Remember - BRUSH YOUR TEETH THE MORNING OF SURGERY WITH YOUR REGULAR TOOTHPASTE  DENTURES WILL BE REMOVED PRIOR TO SURGERY PLEASE DO NOT APPLY "Poly grip" OR ADHESIVES!!!   Do NOT smoke after Midnight   Take these medicines the morning of surgery  with A SIP OF WATER: NONE.  DO NOT TAKE ANY ORAL DIABETIC MEDICATIONS DAY OF YOUR SURGERY  Hold vitamins,supplements and over the counter medicines 7 days before surgery.                              You may not have any metal on your body including hair pins, jewelry, and body piercing             Do not wear make-up, lotions, powders, perfumes/cologne, or deodorant  Do not wear nail polish including gel and S&S, artificial/acrylic nails, or any other type of covering on natural nails including finger and toenails. If you have artificial nails, gel coating, etc. that needs to be removed by a nail salon please have this removed prior to surgery or surgery may need to be canceled/ delayed if the surgeon/ anesthesia feels like they are unable to be safely monitored.   Do not shave  48 hours prior to surgery.    Do not bring valuables to the hospital. [facility] IS NOT             RESPONSIBLE   FOR VALUABLES.   Contacts, glasses, or bridgework may not be worn into surgery.   Bring small overnight bag day of surgery.   DO NOT BRING YOUR HOME MEDICATIONS TO THE HOSPITAL. PHARMACY WILL DISPENSE MEDICATIONS LISTED ON YOUR MEDICATION LIST TO YOU DURING YOUR ADMISSION IN THE HOSPITAL!    Patients discharged on the day of surgery will not be allowed to drive home.  Someone NEEDS to stay with you for the first 24 hours after anesthesia.   Special Instructions: Bring a copy of your healthcare power of attorney and living will documents         the day of surgery if you haven't scanned them before.              Please read over the following fact sheets you  were given: IF YOU HAVE QUESTIONS ABOUT YOUR PRE-OP INSTRUCTIONS PLEASE CALL [phone]      Pre-operative 5 CHG Bath Instructions   You can play a key role in reducing the risk of infection after surgery. Your skin needs to be as free of germs as possible. You can reduce the number of germs on your skin by washing with CHG (chlorhexidine gluconate) soap before surgery. CHG is an antiseptic soap that kills germs and continues to kill germs even after washing.   DO NOT use if you have an allergy to chlorhexidine/CHG or antibacterial soaps. If your skin becomes reddened or irritated, stop using the CHG and notify one of our RNs at [phone].   Please shower with the CHG soap starting 4 days before surgery using the following schedule:     Please keep in mind the following:  DO NOT shave, including legs and underarms, starting the day of your first shower.   You may shave your face at any point before/day of surgery.  Place clean sheets on your bed the day you start using CHG soap. Use a clean washcloth (not used since being washed) for each shower. DO NOT sleep with pets once you start using the CHG.   CHG Shower Instructions:  If you choose to wash your hair and private area, wash first with your normal shampoo/soap.  After you use shampoo/soap, rinse your hair and body thoroughly to remove shampoo/soap residue.  Turn the water OFF and apply  about 3 tablespoons (45 ml) of CHG soap to a CLEAN washcloth.  Apply CHG soap ONLY FROM YOUR NECK DOWN TO YOUR TOES (washing for 3-5 minutes)  DO NOT use CHG soap on face, private areas, open wounds, or sores.  Pay special attention to the area where your surgery is being performed.  If you are having back surgery, having someone wash your back for you may be helpful. Wait 2 minutes after CHG soap is applied, then you may rinse off the CHG soap.  Pat dry with a clean towel  Put on clean clothes/pajamas   If you choose to wear lotion, please  use ONLY the CHG-compatible lotions on the back of this paper.     Additional instructions for the day of surgery: DO NOT APPLY any lotions, deodorants, cologne, or perfumes.   Put on clean/comfortable clothes.  Brush your teeth.  Ask your nurse before applying any prescription medications to the skin.   CHG Compatible Lotions   Aveeno Moisturizing lotion  Cetaphil Moisturizing Cream  Cetaphil Moisturizing Lotion  Clairol Herbal Essence Moisturizing Lotion, Dry Skin  Clairol Herbal Essence Moisturizing Lotion, Extra Dry Skin  Clairol Herbal Essence Moisturizing Lotion, Normal Skin  Curel Age Defying Therapeutic Moisturizing Lotion with Alpha Hydroxy  Curel Extreme Care Body Lotion  Curel Soothing Hands Moisturizing Hand Lotion  Curel Therapeutic Moisturizing Cream, Fragrance-Free  Curel Therapeutic Moisturizing Lotion, Fragrance-Free  Curel Therapeutic Moisturizing Lotion, Original Formula  Eucerin Daily Replenishing Lotion  Eucerin Dry Skin Therapy Plus Alpha Hydroxy Crme  Eucerin Dry Skin Therapy Plus Alpha Hydroxy Lotion  Eucerin Original Crme  Eucerin Original Lotion  Eucerin Plus Crme Eucerin Plus Lotion  Eucerin TriLipid Replenishing Lotion  Keri Anti-Bacterial Hand Lotion  Keri Deep Conditioning Original Lotion Dry Skin Formula Softly Scented  Keri Deep Conditioning Original Lotion, Fragrance Free Sensitive Skin Formula  Keri Lotion Fast Absorbing Fragrance Free Sensitive Skin Formula  Keri Lotion Fast Absorbing Softly Scented Dry Skin Formula  Keri Original Lotion  Keri Skin Renewal Lotion Keri Silky Smooth Lotion  Keri Silky Smooth Sensitive Skin Lotion  Nivea Body Creamy Conditioning Oil  Nivea Body Extra Enriched Teacher, adult education Moisturizing Lotion Nivea Crme  Nivea Skin Firming Lotion  NutraDerm 30 Skin Lotion  NutraDerm Skin Lotion  NutraDerm Therapeutic Skin Cream  NutraDerm Therapeutic Skin Lotion  ProShield  Protective Hand Cream  Provon moisturizing lotion

## 2023-05-06 ENCOUNTER — Other Ambulatory Visit: Payer: Self-pay

## 2023-05-06 ENCOUNTER — Encounter (HOSPITAL_COMMUNITY): Payer: Self-pay

## 2023-05-06 ENCOUNTER — Encounter (HOSPITAL_COMMUNITY)
Admission: RE | Admit: 2023-05-06 | Discharge: 2023-05-06 | Disposition: A | Discharge status: Home / Self Care | Payer: Medicaid Other | Source: Ambulatory Visit | Attending: Orthopedic Surgery | Admitting: Orthopedic Surgery

## 2023-05-06 VITALS — BP 162/96 | HR 83 | Temp 98.4°F | Ht 69.0 in | Wt 263.0 lb

## 2023-05-06 DIAGNOSIS — G8929 Other chronic pain: Secondary | ICD-10-CM | POA: Diagnosis not present

## 2023-05-06 DIAGNOSIS — Z01818 Encounter for other preprocedural examination: Secondary | ICD-10-CM

## 2023-05-06 DIAGNOSIS — M25551 Pain in right hip: Secondary | ICD-10-CM | POA: Insufficient documentation

## 2023-05-06 DIAGNOSIS — Z01812 Encounter for preprocedural laboratory examination: Secondary | ICD-10-CM | POA: Insufficient documentation

## 2023-05-06 HISTORY — DX: Unspecified osteoarthritis, unspecified site: M19.90

## 2023-05-06 LAB — CBC WITH DIFFERENTIAL/PLATELET
Abs Immature Granulocytes: 0.03 10*3/uL (ref 0.00–0.07)
Basophils Absolute: 0 10*3/uL (ref 0.0–0.1)
Basophils Relative: 0 %
Eosinophils Absolute: 0.1 10*3/uL (ref 0.0–0.5)
Eosinophils Relative: 1 %
HCT: 39.1 % (ref 36.0–46.0)
Hemoglobin: 12.9 g/dL (ref 12.0–15.0)
Immature Granulocytes: 1 %
Lymphocytes Relative: 33 %
Lymphs Abs: 2.2 10*3/uL (ref 0.7–4.0)
MCH: 29.4 pg (ref 26.0–34.0)
MCHC: 33 g/dL (ref 30.0–36.0)
MCV: 89.1 fL (ref 80.0–100.0)
Monocytes Absolute: 0.5 10*3/uL (ref 0.1–1.0)
Monocytes Relative: 8 %
Neutro Abs: 3.8 10*3/uL (ref 1.7–7.7)
Neutrophils Relative %: 57 %
Platelets: 254 10*3/uL (ref 150–400)
RBC: 4.39 MIL/uL (ref 3.87–5.11)
RDW: 12.8 % (ref 11.5–15.5)
WBC: 6.6 10*3/uL (ref 4.0–10.5)
nRBC: 0 % (ref 0.0–0.2)

## 2023-05-06 LAB — COMPREHENSIVE METABOLIC PANEL
ALT: 19 U/L (ref 0–44)
AST: 17 U/L (ref 15–41)
Albumin: 3.9 g/dL (ref 3.5–5.0)
Alkaline Phosphatase: 55 U/L (ref 38–126)
Anion gap: 10 (ref 5–15)
BUN: 10 mg/dL (ref 6–20)
CO2: 26 mmol/L (ref 22–32)
Calcium: 9 mg/dL (ref 8.9–10.3)
Chloride: 105 mmol/L (ref 98–111)
Creatinine, Ser: 0.5 mg/dL (ref 0.44–1.00)
GFR, Estimated: >60 mL/min (ref >60)
Glucose, Bld: 100 mg/dL — ABNORMAL HIGH (ref 70–99)
Potassium: 3.6 mmol/L (ref 3.5–5.1)
Sodium: 141 mmol/L (ref 135–145)
Total Bilirubin: 1.2 mg/dL (ref 0.0–1.2)
Total Protein: 7.8 g/dL (ref 6.5–8.1)

## 2023-05-06 LAB — TYPE AND SCREEN
ABO/RH(D): AB POS
Antibody Screen: NEGATIVE

## 2023-05-06 LAB — SURGICAL PCR SCREEN
MRSA, PCR: NEGATIVE
Staphylococcus aureus: NEGATIVE

## 2023-05-06 NOTE — Progress Notes (Addendum)
For Anesthesia: PCP - Diamantina Providence, FNP  Cardiologist -   Bowel Prep reminder:  Chest x-ray -  EKG -  Stress Test -  ECHO -  Cardiac Cath -  Pacemaker/ICD device last checked: Pacemaker orders received: Device Rep notified:  Spinal Cord Stimulator:  Sleep Study -  CPAP -   Fasting Blood Sugar - N/A Checks Blood Sugar _____ times a day Date and result of last Hgb A1c-  Last dose of GLP1 agonist- N/A GLP1 instructions:   Last dose of SGLT-2 inhibitors- N/A SGLT-2 instructions:   Blood Thinner Instructions:N/A Aspirin Instructions: Last Dose:  Activity level: Can go up a flight of stairs and activities of daily living without stopping and without chest pain and/or shortness of breath   Able to exercise without chest pain and/or shortness of breath  Anesthesia review:   Patient denies shortness of breath, fever, cough and chest pain at PAT appointment   Patient verbalized understanding of instructions that were given to them at the PAT appointment. Patient was also instructed that they will need to review over the PAT instructions again at home before surgery.

## 2023-05-19 ENCOUNTER — Encounter (HOSPITAL_COMMUNITY): Payer: Self-pay | Admitting: Orthopedic Surgery

## 2023-05-19 ENCOUNTER — Other Ambulatory Visit: Payer: Self-pay

## 2023-05-19 ENCOUNTER — Encounter (HOSPITAL_COMMUNITY)
Admission: RE | Disposition: A | Discharge status: Home / Self Care | Payer: Self-pay | Source: Home / Self Care | Attending: Orthopedic Surgery

## 2023-05-19 ENCOUNTER — Ambulatory Visit (HOSPITAL_COMMUNITY): Admitting: Anesthesiology

## 2023-05-19 ENCOUNTER — Ambulatory Visit (HOSPITAL_COMMUNITY)
Admission: RE | Admit: 2023-05-19 | Discharge: 2023-05-19 | Disposition: A | Discharge status: Home / Self Care | Payer: BC Managed Care – PPO | Attending: Orthopedic Surgery | Admitting: Orthopedic Surgery

## 2023-05-19 ENCOUNTER — Ambulatory Visit (HOSPITAL_COMMUNITY)

## 2023-05-19 DIAGNOSIS — S79011G Salter-Harris Type I physeal fracture of upper end of right femur, subsequent encounter for fracture with delayed healing: Secondary | ICD-10-CM | POA: Insufficient documentation

## 2023-05-19 DIAGNOSIS — Z87891 Personal history of nicotine dependence: Secondary | ICD-10-CM | POA: Diagnosis not present

## 2023-05-19 DIAGNOSIS — Z9851 Tubal ligation status: Secondary | ICD-10-CM | POA: Diagnosis not present

## 2023-05-19 DIAGNOSIS — M1611 Unilateral primary osteoarthritis, right hip: Secondary | ICD-10-CM | POA: Diagnosis present

## 2023-05-19 DIAGNOSIS — X58XXXD Exposure to other specified factors, subsequent encounter: Secondary | ICD-10-CM | POA: Insufficient documentation

## 2023-05-19 DIAGNOSIS — Z01818 Encounter for other preprocedural examination: Secondary | ICD-10-CM

## 2023-05-19 DIAGNOSIS — G8929 Other chronic pain: Secondary | ICD-10-CM

## 2023-05-19 DIAGNOSIS — M1651 Unilateral post-traumatic osteoarthritis, right hip: Secondary | ICD-10-CM | POA: Insufficient documentation

## 2023-05-19 HISTORY — PX: TOTAL HIP ARTHROPLASTY: SHX124

## 2023-05-19 LAB — CBC
HCT: 38.6 % (ref 36.0–46.0)
Hemoglobin: 12.5 g/dL (ref 12.0–15.0)
MCH: 28.9 pg (ref 26.0–34.0)
MCHC: 32.4 g/dL (ref 30.0–36.0)
MCV: 89.1 fL (ref 80.0–100.0)
Platelets: 277 10*3/uL (ref 150–400)
RBC: 4.33 MIL/uL (ref 3.87–5.11)
RDW: 12.6 % (ref 11.5–15.5)
WBC: 12.7 10*3/uL — ABNORMAL HIGH (ref 4.0–10.5)
nRBC: 0 % (ref 0.0–0.2)

## 2023-05-19 LAB — POCT PREGNANCY, URINE: Preg Test, Ur: NEGATIVE

## 2023-05-19 SURGERY — ARTHROPLASTY, HIP, TOTAL,POSTERIOR APPROACH
Anesthesia: Spinal | Site: Hip | Laterality: Right

## 2023-05-19 MED ORDER — BUPIVACAINE LIPOSOME 1.3 % IJ SUSP
INTRAMUSCULAR | Status: AC
  Filled 2023-05-19: qty 20

## 2023-05-19 MED ORDER — FENTANYL CITRATE (PF) 100 MCG/2ML IJ SOLN
INTRAMUSCULAR | Status: DC | PRN
  Administered 2023-05-19 (×2): 12.5 ug via INTRAVENOUS
  Administered 2023-05-19 (×2): 50 ug via INTRAVENOUS
  Administered 2023-05-19: 25 ug via INTRAVENOUS

## 2023-05-19 MED ORDER — ALBUMIN HUMAN 5 % IV SOLN
INTRAVENOUS | Status: AC
  Filled 2023-05-19: qty 250

## 2023-05-19 MED ORDER — PROPOFOL 1000 MG/100ML IV EMUL
INTRAVENOUS | Status: AC
  Filled 2023-05-19: qty 100

## 2023-05-19 MED ORDER — LACTATED RINGERS IV BOLUS: 250.0000 mL | Freq: Once | INTRAVENOUS | Status: DC

## 2023-05-19 MED ORDER — CEFAZOLIN SODIUM-DEXTROSE 3-4 GM/150ML-% IV SOLN
3.0000 g | INTRAVENOUS | Status: AC
  Administered 2023-05-19: 3 g via INTRAVENOUS
  Filled 2023-05-19: qty 150

## 2023-05-19 MED ORDER — FENTANYL CITRATE PF 50 MCG/ML IJ SOSY
PREFILLED_SYRINGE | INTRAMUSCULAR | Status: AC
  Filled 2023-05-19: qty 1

## 2023-05-19 MED ORDER — DEXAMETHASONE SODIUM PHOSPHATE 10 MG/ML IJ SOLN
8.0000 mg | Freq: Once | INTRAMUSCULAR | Status: AC
  Administered 2023-05-19 (×2): 4 mg via INTRAVENOUS

## 2023-05-19 MED ORDER — METHOCARBAMOL 1000 MG/10ML IJ SOLN: 500.0000 mg | Freq: Four times a day (QID) | INTRAMUSCULAR | Status: DC | PRN

## 2023-05-19 MED ORDER — CEFAZOLIN SODIUM-DEXTROSE 2-4 GM/100ML-% IV SOLN: 2.0000 g | Freq: Four times a day (QID) | INTRAVENOUS | Status: DC

## 2023-05-19 MED ORDER — PHENYLEPHRINE HCL-NACL 20-0.9 MG/250ML-% IV SOLN
INTRAVENOUS | Status: AC
  Filled 2023-05-19: qty 250

## 2023-05-19 MED ORDER — LIDOCAINE HCL (PF) 2 % IJ SOLN
INTRAMUSCULAR | Status: AC
  Filled 2023-05-19: qty 5

## 2023-05-19 MED ORDER — OXYCODONE HCL 5 MG PO TABS
ORAL_TABLET | ORAL | Status: AC
Start: 2023-05-19 — End: 2023-05-19
  Administered 2023-05-19: 5 mg via ORAL
  Filled 2023-05-19: qty 1

## 2023-05-19 MED ORDER — OXYCODONE HCL 5 MG PO TABS: 5.0000 mg | ORAL_TABLET | ORAL | Status: DC | PRN

## 2023-05-19 MED ORDER — METHOCARBAMOL 500 MG PO TABS: 500.0000 mg | ORAL_TABLET | Freq: Three times a day (TID) | ORAL | 0 refills | Status: AC | PRN

## 2023-05-19 MED ORDER — DEXMEDETOMIDINE HCL IN NACL 80 MCG/20ML IV SOLN
INTRAVENOUS | Status: AC
  Filled 2023-05-19: qty 20

## 2023-05-19 MED ORDER — OXYCODONE HCL 5 MG PO TABS
2.5000 mg | ORAL_TABLET | Freq: Four times a day (QID) | ORAL | 0 refills | Status: AC | PRN
Start: 2023-05-19 — End: 2023-05-26

## 2023-05-19 MED ORDER — MIDAZOLAM HCL 5 MG/5ML IJ SOLN
INTRAMUSCULAR | Status: DC | PRN
  Administered 2023-05-19 (×2): 1 mg via INTRAVENOUS

## 2023-05-19 MED ORDER — BUPIVACAINE-EPINEPHRINE (PF) 0.25% -1:200000 IJ SOLN
INTRAMUSCULAR | Status: AC
  Filled 2023-05-19: qty 30

## 2023-05-19 MED ORDER — CELECOXIB 100 MG PO CAPS: 100.0000 mg | ORAL_CAPSULE | Freq: Two times a day (BID) | ORAL | 0 refills | Status: AC

## 2023-05-19 MED ORDER — ONDANSETRON HCL 4 MG PO TABS: 4.0000 mg | ORAL_TABLET | Freq: Four times a day (QID) | ORAL | Status: DC | PRN

## 2023-05-19 MED ORDER — SODIUM CHLORIDE (PF) 0.9 % IJ SOLN
INTRAMUSCULAR | Status: AC
  Filled 2023-05-19: qty 30

## 2023-05-19 MED ORDER — ONDANSETRON HCL 4 MG/2ML IJ SOLN
INTRAMUSCULAR | Status: DC | PRN
  Administered 2023-05-19: 4 mg via INTRAVENOUS

## 2023-05-19 MED ORDER — ACETAMINOPHEN 500 MG PO TABS: 1000.0000 mg | ORAL_TABLET | Freq: Three times a day (TID) | ORAL | Status: AC | PRN

## 2023-05-19 MED ORDER — LACTATED RINGERS IV BOLUS
500.0000 mL | Freq: Once | INTRAVENOUS | Status: AC
Start: 2023-05-19 — End: 2023-05-19
  Administered 2023-05-19: 500 mL via INTRAVENOUS

## 2023-05-19 MED ORDER — 0.9 % SODIUM CHLORIDE (POUR BTL) OPTIME
TOPICAL | Status: DC | PRN
  Administered 2023-05-19: 1000 mL

## 2023-05-19 MED ORDER — CHLORHEXIDINE GLUCONATE 0.12 % MT SOLN
15.0000 mL | Freq: Once | OROMUCOSAL | Status: AC
  Administered 2023-05-19: 15 mL via OROMUCOSAL

## 2023-05-19 MED ORDER — HYDROMORPHONE HCL 1 MG/ML IJ SOLN: 0.5000 mg | INTRAMUSCULAR | Status: DC | PRN

## 2023-05-19 MED ORDER — DEXMEDETOMIDINE HCL IN NACL 80 MCG/20ML IV SOLN
INTRAVENOUS | Status: DC | PRN
Start: 2023-05-19 — End: 2023-05-19
  Administered 2023-05-19: 4 ug via INTRAVENOUS
  Administered 2023-05-19: 8 ug via INTRAVENOUS

## 2023-05-19 MED ORDER — APIXABAN 2.5 MG PO TABS: 2.5000 mg | ORAL_TABLET | Freq: Two times a day (BID) | ORAL | 0 refills | Status: DC

## 2023-05-19 MED ORDER — ONDANSETRON HCL 4 MG/2ML IJ SOLN
INTRAMUSCULAR | Status: AC
  Filled 2023-05-19: qty 2

## 2023-05-19 MED ORDER — ONDANSETRON HCL 4 MG/2ML IJ SOLN: 4.0000 mg | Freq: Four times a day (QID) | INTRAMUSCULAR | Status: DC | PRN

## 2023-05-19 MED ORDER — ACETAMINOPHEN 500 MG PO TABS
1000.0000 mg | ORAL_TABLET | Freq: Once | ORAL | Status: AC
Start: 2023-05-19 — End: 2023-05-19
  Administered 2023-05-19: 1000 mg via ORAL
  Filled 2023-05-19: qty 2

## 2023-05-19 MED ORDER — TRANEXAMIC ACID-NACL 1000-0.7 MG/100ML-% IV SOLN
1000.0000 mg | INTRAVENOUS | Status: AC
  Administered 2023-05-19: 1000 mg via INTRAVENOUS
  Filled 2023-05-19: qty 100

## 2023-05-19 MED ORDER — ONDANSETRON HCL 4 MG PO TABS: 4.0000 mg | ORAL_TABLET | Freq: Three times a day (TID) | ORAL | 0 refills | Status: AC | PRN

## 2023-05-19 MED ORDER — KETOROLAC TROMETHAMINE 15 MG/ML IJ SOLN: 15.0000 mg | Freq: Four times a day (QID) | INTRAMUSCULAR | Status: DC

## 2023-05-19 MED ORDER — BUPIVACAINE LIPOSOME 1.3 % IJ SUSP: 10.0000 mL | Freq: Once | INTRAMUSCULAR | Status: DC

## 2023-05-19 MED ORDER — FENTANYL CITRATE (PF) 100 MCG/2ML IJ SOLN
INTRAMUSCULAR | Status: AC
Start: 2023-05-19 — End: ?
  Filled 2023-05-19: qty 2

## 2023-05-19 MED ORDER — WATER FOR IRRIGATION, STERILE IR SOLN
Status: DC | PRN
  Administered 2023-05-19: 1000 mL

## 2023-05-19 MED ORDER — ISOPROPYL ALCOHOL 70 % SOLN
Status: DC | PRN
  Administered 2023-05-19: 1 via TOPICAL

## 2023-05-19 MED ORDER — SODIUM CHLORIDE 0.9 % IV SOLN: INTRAVENOUS | Status: DC

## 2023-05-19 MED ORDER — SODIUM CHLORIDE 0.9 % IV SOLN: 12.5000 mg | INTRAVENOUS | Status: DC | PRN

## 2023-05-19 MED ORDER — OXYCODONE HCL 5 MG PO TABS
5.0000 mg | ORAL_TABLET | Freq: Once | ORAL | Status: AC | PRN
  Administered 2023-05-19: 5 mg via ORAL

## 2023-05-19 MED ORDER — AMISULPRIDE (ANTIEMETIC) 5 MG/2ML IV SOLN: 10.0000 mg | Freq: Once | INTRAVENOUS | Status: DC | PRN

## 2023-05-19 MED ORDER — ALBUMIN HUMAN 5 % IV SOLN: INTRAVENOUS | Status: DC | PRN

## 2023-05-19 MED ORDER — POVIDONE-IODINE 10 % EX SWAB: 2.0000 | Freq: Once | CUTANEOUS | Status: DC

## 2023-05-19 MED ORDER — OMEPRAZOLE 40 MG PO CPDR: 40.0000 mg | DELAYED_RELEASE_CAPSULE | Freq: Every day | ORAL | 0 refills | Status: DC

## 2023-05-19 MED ORDER — POLYETHYLENE GLYCOL 3350 17 G PO PACK: 17.0000 g | PACK | Freq: Every day | ORAL | 0 refills | Status: DC

## 2023-05-19 MED ORDER — MIDAZOLAM HCL 2 MG/2ML IJ SOLN
INTRAMUSCULAR | Status: AC
  Filled 2023-05-19: qty 2

## 2023-05-19 MED ORDER — OXYCODONE HCL 5 MG PO TABS
ORAL_TABLET | ORAL | Status: DC
Start: 2023-05-19 — End: 2023-05-20
  Filled 2023-05-19: qty 1

## 2023-05-19 MED ORDER — LACTATED RINGERS IV SOLN: INTRAVENOUS | Status: DC

## 2023-05-19 MED ORDER — SODIUM CHLORIDE 0.9 % IR SOLN
Status: DC | PRN
  Administered 2023-05-19: 1000 mL
  Administered 2023-05-19: 250 mL

## 2023-05-19 MED ORDER — OXYCODONE HCL 5 MG/5ML PO SOLN: 5.0000 mg | Freq: Once | ORAL | Status: AC | PRN

## 2023-05-19 MED ORDER — ACETAMINOPHEN 500 MG PO TABS: 1000.0000 mg | ORAL_TABLET | Freq: Four times a day (QID) | ORAL | Status: DC

## 2023-05-19 MED ORDER — METHOCARBAMOL 500 MG PO TABS
500.0000 mg | ORAL_TABLET | Freq: Four times a day (QID) | ORAL | Status: DC | PRN
  Administered 2023-05-19: 500 mg via ORAL

## 2023-05-19 MED ORDER — ORAL CARE MOUTH RINSE: 15.0000 mL | Freq: Once | OROMUCOSAL | Status: AC

## 2023-05-19 MED ORDER — BUPIVACAINE IN DEXTROSE 0.75-8.25 % IT SOLN
INTRATHECAL | Status: DC | PRN
Start: 2023-05-19 — End: 2023-05-19
  Administered 2023-05-19: 2 mL via INTRATHECAL

## 2023-05-19 MED ORDER — DEXAMETHASONE SODIUM PHOSPHATE 10 MG/ML IJ SOLN
INTRAMUSCULAR | Status: AC
  Filled 2023-05-19: qty 1

## 2023-05-19 MED ORDER — ACETAMINOPHEN 500 MG PO TABS
ORAL_TABLET | ORAL | Status: AC
  Filled 2023-05-19: qty 2

## 2023-05-19 MED ORDER — METHOCARBAMOL 500 MG PO TABS
ORAL_TABLET | ORAL | Status: AC
  Filled 2023-05-19: qty 1

## 2023-05-19 MED ORDER — PROPOFOL 500 MG/50ML IV EMUL
INTRAVENOUS | Status: DC | PRN
  Administered 2023-05-19: 60 ug/kg/min via INTRAVENOUS

## 2023-05-19 MED ORDER — ACETAMINOPHEN 325 MG PO TABS: 325.0000 mg | ORAL_TABLET | Freq: Four times a day (QID) | ORAL | Status: DC | PRN

## 2023-05-19 MED ORDER — FENTANYL CITRATE PF 50 MCG/ML IJ SOSY
25.0000 ug | PREFILLED_SYRINGE | INTRAMUSCULAR | Status: DC | PRN
  Administered 2023-05-19: 50 ug via INTRAVENOUS

## 2023-05-19 MED ORDER — PHENYLEPHRINE HCL-NACL 20-0.9 MG/250ML-% IV SOLN
INTRAVENOUS | Status: DC | PRN
  Administered 2023-05-19: 40 ug/min via INTRAVENOUS

## 2023-05-19 MED ORDER — SODIUM CHLORIDE (PF) 0.9 % IJ SOLN
INTRAMUSCULAR | Status: DC | PRN
  Administered 2023-05-19: 80 mL

## 2023-05-19 MED ORDER — FENTANYL CITRATE (PF) 100 MCG/2ML IJ SOLN
INTRAMUSCULAR | Status: AC
  Filled 2023-05-19: qty 2

## 2023-05-19 SURGICAL SUPPLY — 71 items
BAG COUNTER SPONGE SURGICOUNT (BAG) IMPLANT
BAG ZIPLOCK 12X15 (MISCELLANEOUS) ×2 IMPLANT
BIT DRILL TRIDENT 4X40 SU (BIT) IMPLANT
BLADE SAW SAG 25X90X1.19 (BLADE) ×2 IMPLANT
CEMENT BONE SIMPLEX SPEEDSET (Cement) IMPLANT
CHLORAPREP W/TINT 26 (MISCELLANEOUS) ×4 IMPLANT
CNTNR URN SCR LID CUP LEK RST (MISCELLANEOUS) ×2 IMPLANT
COVER SURGICAL LIGHT HANDLE (MISCELLANEOUS) ×2 IMPLANT
DERMABOND ADVANCED .7 DNX12 (GAUZE/BANDAGES/DRESSINGS) ×2 IMPLANT
DRAPE HIP W/POCKET STRL (MISCELLANEOUS) ×2 IMPLANT
DRAPE INCISE IOBAN 66X45 STRL (DRAPES) ×2 IMPLANT
DRAPE INCISE IOBAN 85X60 (DRAPES) ×2 IMPLANT
DRAPE POUCH INSTRU U-SHP 10X18 (DRAPES) ×2 IMPLANT
DRAPE SHEET LG 3/4 BI-LAMINATE (DRAPES) ×6 IMPLANT
DRAPE U-SHAPE 47X51 STRL (DRAPES) ×4 IMPLANT
DRSG AQUACEL AG ADV 3.5X10 (GAUZE/BANDAGES/DRESSINGS) ×2 IMPLANT
DRSG AQUACEL AG ADV 3.5X14 (GAUZE/BANDAGES/DRESSINGS) IMPLANT
ELECT BLADE TIP CTD 4 INCH (ELECTRODE) ×2 IMPLANT
ELECT REM PT RETURN 15FT ADLT (MISCELLANEOUS) ×2 IMPLANT
GAUZE SPONGE 4X4 12PLY STRL (GAUZE/BANDAGES/DRESSINGS) ×2 IMPLANT
GLOVE BIO SURGEON STRL SZ 6.5 (GLOVE) ×4 IMPLANT
GLOVE BIOGEL PI IND STRL 6.5 (GLOVE) ×2 IMPLANT
GLOVE BIOGEL PI IND STRL 8 (GLOVE) ×2 IMPLANT
GLOVE SURG ORTHO 8.0 STRL STRW (GLOVE) ×4 IMPLANT
GOWN STRL REUS W/ TWL XL LVL3 (GOWN DISPOSABLE) ×4 IMPLANT
HEAD BIOLOX HIP 36/+2.5 (Joint) IMPLANT
HIP BIOLOX HD 36/+2.5 (Joint) ×1 IMPLANT
HOLDER FOLEY CATH W/STRAP (MISCELLANEOUS) ×2 IMPLANT
HOOD PEEL AWAY T7 (MISCELLANEOUS) ×6 IMPLANT
INSERT TRIDENT POLY 36 0DEG (Insert) IMPLANT
INSERT TRIDENT POLY E 36OD (Insert) IMPLANT
KIT BASIN OR (CUSTOM PROCEDURE TRAY) ×2 IMPLANT
KIT TURNOVER KIT A (KITS) IMPLANT
MANIFOLD NEPTUNE II (INSTRUMENTS) ×2 IMPLANT
MARKER SKIN DUAL TIP RULER LAB (MISCELLANEOUS) ×2 IMPLANT
NDL SAFETY ECLIPSE 18X1.5 (NEEDLE) ×4 IMPLANT
NS IRRIG 1000ML POUR BTL (IV SOLUTION) ×2 IMPLANT
PACK TOTAL JOINT (CUSTOM PROCEDURE TRAY) ×2 IMPLANT
PAD ARMBOARD 7.5X6 YLW CONV (MISCELLANEOUS) ×2 IMPLANT
PRESSURIZER FEMORAL UNIV (MISCELLANEOUS) IMPLANT
PROTECTOR NERVE ULNAR (MISCELLANEOUS) IMPLANT
RETRIEVER SUT HEWSON (MISCELLANEOUS) ×2 IMPLANT
SCREW HEX LP 6.5X25 (Screw) IMPLANT
SCREW HEX LP 6.5X30 (Screw) IMPLANT
SCREW HEX LP 6.5X35 (Screw) IMPLANT
SEALER BIPOLAR AQUA 6.0 (INSTRUMENTS) IMPLANT
SET HNDPC FAN SPRY TIP SCT (DISPOSABLE) IMPLANT
SHELL CLUSTERHOLE ACETABULAR 5 (Shell) IMPLANT
SHELL GRIPTION 100 48MM (Shell) IMPLANT
SHIELD FACE FULL FLUID (MISCELLANEOUS) ×2 IMPLANT
SOLUTION IRRIG SURGIPHOR (IV SOLUTION) IMPLANT
SPIKE FLUID TRANSFER (MISCELLANEOUS) ×2 IMPLANT
STEM ACCOLADE SZ 6 (Hips) IMPLANT
SUCTION TUBE FRAZIER 12FR DISP (SUCTIONS) ×2 IMPLANT
SUT BONE WAX W31G (SUTURE) ×2 IMPLANT
SUT ETHIBOND #5 BRAIDED 30INL (SUTURE) ×2 IMPLANT
SUT MNCRL AB 3-0 PS2 18 (SUTURE) ×2 IMPLANT
SUT STRATAFIX 0 PDS 27 VIOLET (SUTURE) ×2 IMPLANT
SUT STRATAFIX 14 PDO 48 VLT (SUTURE) ×2 IMPLANT
SUT STRATAFIX PDO 1 14 VIOLET (SUTURE) ×1 IMPLANT
SUT VIC AB 0 CT1 27XBRD ANTBC (SUTURE) IMPLANT
SUT VIC AB 2-0 CT2 27 (SUTURE) ×4 IMPLANT
SUTURE STRATFX 0 PDS 27 VIOLET (SUTURE) ×2 IMPLANT
SYR 30ML LL (SYRINGE) ×2 IMPLANT
SYR 50ML LL SCALE MARK (SYRINGE) ×2 IMPLANT
TOWEL OR 17X26 10 PK STRL BLUE (TOWEL DISPOSABLE) ×2 IMPLANT
TOWER CARTRIDGE SMART MIX (DISPOSABLE) IMPLANT
TRAY FOLEY MTR SLVR 16FR STAT (SET/KITS/TRAYS/PACK) IMPLANT
TUBE SUCTION HIGH CAP CLEAR NV (SUCTIONS) ×2 IMPLANT
UNDERPAD 30X36 HEAVY ABSORB (UNDERPADS AND DIAPERS) ×2 IMPLANT
WATER STERILE IRR 1000ML POUR (IV SOLUTION) ×4 IMPLANT

## 2023-05-19 NOTE — Anesthesia Postprocedure Evaluation (Signed)
 Anesthesia Post Note  Patient: Dana Cole  Procedure(s) Performed: TOTAL HIP ARTHROPLASTY (Right: Hip)     Patient location during evaluation: PACU Anesthesia Type: Spinal Level of consciousness: awake and alert Pain management: pain level controlled Vital Signs Assessment: post-procedure vital signs reviewed and stable Respiratory status: spontaneous breathing, nonlabored ventilation and respiratory function stable Cardiovascular status: stable and blood pressure returned to baseline Anesthetic complications: no   No notable events documented.  Last Vitals:  Vitals:   05/19/23 1430 05/19/23 1445  BP: (!) 146/89   Pulse: (!) 55 (!) 51  Resp: 11 12  Temp:  (!) 36.4 C  SpO2: 100% 100%    Last Pain:  Vitals:   05/19/23 1430  TempSrc:   PainSc: 0-No pain    LLE Motor Response: Purposeful movement (05/19/23 1445)   RLE Motor Response: Purposeful movement (05/19/23 1445)   L Sensory Level: L3-Anterior knee, lower leg (05/19/23 1445) R Sensory Level: S1-Sole of foot, small toes (05/19/23 1445)  Beryle Lathe

## 2023-05-19 NOTE — Op Note (Signed)
 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  She over the years his develop progressively worsening pain and symptoms secondary to posttraumatic osteoarthritis.  Tried and failed conservative treatment..  The risks benefits and alternatives were discussed with the patient including but not limited to the risks of nonoperative treatment, versus surgical intervention including infection, bleeding, nerve injury, periprosthetic fracture, the need for revision surgery, dislocation, leg length discrepancy, blood clots, cardiopulmonary complications, morbidity, mortality, among others, and they were willing to proceed.     OPERATIVE REPORT     SURGEON:  Weber Cooks, MD    ASSISTANT: Kathie Dike, PA-C, (Present throughout the entire procedure,  necessary for completion of procedure in a timely manner, assisting with retraction, instrumentation, and closure)     ANESTHESIA:  spinal  ESTIMATED BLOOD LOSS: 350cc    COMPLICATIONS:  None.    COMPONENTS:  Stryker 52 mm acetabular shell, 6.5 hex screws x 2, Trident X.3 10 degree polyethylene insert, Accolade 2 size #6 with 127 degree neck angle, 36+2.5 mm ceramic head Implant Name Type Inv. Item Serial No. Manufacturer Lot No. LRB No. Used Action  SHELL CLUSTERHOLE ACETABULAR 5 - [id number] Shell SHELL CLUSTERHOLE ACETABULAR 5  [employer] [id number] Right 1 Implanted   INSERT TRIDENT POLY 36 0DEG - [id number] Insert INSERT TRIDENT POLY 36 0DEG  [employer] [id number] Right 1 Implanted  SCREW HEX LP 6.5X25 - [id number] Screw SCREW HEX LP 6.5X25  [employer] [id number] Right 1 Implanted  SCREW HEX LP 6.5X35 - [id number] Screw SCREW HEX LP 6.5X35  [employer] [id number] Right 1 Implanted  INSERT TRIDENT POLY E 36OD - [id number] Insert INSERT TRIDENT POLY E 36OD  [employer] [id number] Right 1 Implanted  SCREW HEX LP 6.5X30 - [id number] Screw SCREW HEX LP 6.5X30  [employer] [id number] Right 1 Implanted  STEM ACCOLADE SZ 6 - [id number] Hips STEM ACCOLADE SZ 6  [employer] [id number] Right 1 Implanted  HIP BIOLOX HD 36/+2.5 - [id number] Joint HIP BIOLOX HD 36/+2.5  [employer] [id number] Right 1 Implanted    The aquamantis was utilized for this case to help facilitate better hemostasis as patient was felt to be at increased risk of bleeding because of obesity and complex case requiring increased OR time and/or exposure.        PROCEDURE IN DETAIL:   The patient was met in the holding area and  identified.  The appropriate hip was identified and marked at the operative site.  The patient was then transported to the OR  and  placed under anesthesia.  At that point, the patient was  placed in the lateral decubitus position with the operative side up and  secured to the operating room table  and all bony prominences padded. A subaxillary role was also placed.    The operative lower extremity was prepped from the iliac crest to the distal leg.  Sterile draping was performed.  Preoperative antibiotics, 3 gm of ancef,1 gm of  Tranexamic Acid, and 8 mg of Decadron administered. Time out was performed prior to incision.      A routine posterolateral approach was utilized via sharp dissection  carried down to the subcutaneous tissue.  Gross bleeders were Bovie coagulated.  The iliotibial band was identified and incised along the length of the skin  incision through the glute max fascia.  Charnley retractor was placed with care to protect the sciatic nerve posteriorly.  We then felt the inferior aspect of the vastus ridge to help localize the hip screw.  A small longitudinal incision was made through the vastus lateralis and we carefully spread down to the bone identifying the screw head.  The screw was found to be stripped.  We used the [facility] broken hardware removal set and used a reverse threaded screw removal tool to successfully remove the screw.  This came out without difficulty.  We then continued on with the total hip arthroplasty.  With the hip internally rotated, the piriformis tendon was identified and released from the femoral insertion and tagged with a #5 Ethibond.  A capsulotomy was then performed off the femoral insertion and also tagged with a #5 Ethibond.    The femoral neck was exposed, and I resected the femoral neck based on preoperative templating relative to the lesser trochanter.    I then exposed the deep acetabulum, cleared out any tissue including the ligamentum teres.  After adequate visualization, I excised the labrum.  I then started reaming with a 48 mm reamer, first medializing to the floor of the cotyloid fossa, and then in the position of the cup aiming towards the greater sciatic notch, matching the version of the transverse acetabular ligament and tucked under the anterior wall. I reamed up to 52 mm reamer with good bony bed preparation and a 52 mm cup was chosen.  The real cup was then impacted into place.  Appropriate version and inclination was confirmed clinically matching their bony anatomy, and also with the use of the jig.  I placed 2 screws in the posterior superior quadrant to augment fixation.  A neutral liner was placed and impacted. It was confirmed to be appropriately seated and the acetabular retractors were removed.    I then prepared the proximal femur using the box cutter, Charnley awl, and then  sequentially broached starting with 0 up to a size 6.  Notably the patient had relative retroversion of her femur.  I was able to add some anteversion by broaching lateral and posterior however ultimately had minimal total anteversion of the femoral component.    A trial broach, neck, and head was utilized, and I reduced the hip and it was found to have excellent stability.  There was no impingement with full extension and 90 degrees external rotation.  The hip was stable at the position of sleep and with 90 degrees flexion and 45 degrees of internal rotation.  Leg lengths were also clinically assessed in the lateral position and felt to be equal. Intra-Op flatplate was obtained and confirmed appropriate component positions.  Good fill of the femur with the size 6 broach.  And restoration of leg length and offset. No evidence or concern for fracture.  I felt given the minimal anteversion on the femoral component due to her native version she would benefit from increased posterior coverage on the acetabular side to increase her combined version.  The old liner was removed using a curved osteotome.  This came out without difficulty.  A 10 degree  face changing liner was then utilized and impacted positioned at the 10 o'clock position providing posterior and superior coverage.  A final femoral prosthesis size 6 was selected. I then impacted the real femoral prosthesis into place.I again trialed and selected a 36+ 2.24mm ball. The hip was then reduced and taken through a range of motion. There was no impingement with full extension and 90 degrees external rotation.  The hip was stable at the position of sleep and with 90 degrees flexion and 45 degrees of internal rotation. Leg lengths were  again assessed and felt to be restored.  We then opened, and I impacted the real head ball into place.  The posterior capsule was then closed with #5 Ethibond.   I then irrigated the hip copiously with dilute Betadine and  with normal saline pulse lavage. Periarticular injection was then performed with Exparel.   We repaired the fascia #1 barbed suture, followed by 0 barbed suture for the subcutaneous fat.  Skin was closed with 2-0 Vicryl and 3-0 Monocryl.  Dermabond and Aquacel dressing were applied. The patient was then awakened and returned to PACU in stable and satisfactory condition.  Leg lengths in the supine position were assessed and felt to be clinically equal. There were no complications.  Post op recs: WB: WBAT RLE, posterior precautions x 6 weeks Abx: ancef Imaging: PACU pelvis Xray Dressing: Aquacell, keep intact until follow up DVT prophylaxis: Eliquis 2.5 mg twice daily x 4 weeks given history of DVT Follow up: 2 weeks after surgery for a wound check with Dr. Blanchie Dessert at [facility].  Address: [address], [place], [place] [address]  Office Phone: [phone]   Weber Cooks, MD Orthopedic Surgeon

## 2023-05-19 NOTE — Discharge Instructions (Addendum)
 INSTRUCTIONS AFTER JOINT REPLACEMENT   Remove items at home which could result in a fall. This includes throw rugs or furniture in walking pathways ICE to the affected joint every three hours while awake for 30 minutes at a time, for at least the first 3-5 days, and then as needed for pain and swelling.  Continue to use ice for pain and swelling. You may notice swelling that will progress down to the foot and ankle.  This is normal after surgery.  Elevate your leg when you are not up walking on it.   Continue to use the breathing machine you got in the hospital (incentive spirometer) which will help keep your temperature down.  It is common for your temperature to cycle up and down following surgery, especially at night when you are not up moving around and exerting yourself.  The breathing machine keeps your lungs expanded and your temperature down.  DIET:  As you were doing prior to hospitalization, we recommend a well-balanced diet.  DRESSING / WOUND CARE / SHOWERING:  Keep the surgical dressing until follow up.  The dressing is water proof, so you can shower without any extra covering.  IF THE DRESSING FALLS OFF or the wound gets wet inside, change the dressing with sterile gauze.  Please use good hand washing techniques before changing the dressing.  Do not use any lotions or creams on the incision until instructed by your surgeon.    ACTIVITY  Increase activity slowly as tolerated, but follow the weight bearing instructions below.   No driving for 6 weeks or until further direction given by your physician.  You cannot drive while taking narcotics.  No lifting or carrying greater than 10 lbs. until further directed by your surgeon. Avoid periods of inactivity such as sitting longer than an hour when not asleep. This helps prevent blood clots.  You may return to work once you are authorized by your doctor.   WEIGHT BEARING: Weight bearing as tolerated with assist device (walker, cane, etc) as  directed, use it as long as suggested by your surgeon or therapist, typically at least 4-6 weeks.  EXERCISES  Results after joint replacement surgery are often greatly improved when you follow the exercise, range of motion and muscle strengthening exercises prescribed by your doctor. Safety measures are also important to protect the joint from further injury. Any time any of these exercises cause you to have increased pain or swelling, decrease what you are doing until you are comfortable again and then slowly increase them. If you have problems or questions, call your caregiver or physical therapist for advice.   Rehabilitation is important following a joint replacement. After just a few days of immobilization, the muscles of the leg can become weakened and shrink (atrophy).  These exercises are designed to build up the tone and strength of the thigh and leg muscles and to improve motion. Often times heat used for twenty to thirty minutes before working out will loosen up your tissues and help with improving the range of motion but do not use heat for the first two weeks following surgery (sometimes heat can increase post-operative swelling).   These exercises can be done on a training (exercise) mat, on the floor, on a table or on a bed. Use whatever works the best and is most comfortable for you.    Use music or television while you are exercising so that the exercises are a pleasant break in your day. This will make your life  better with the exercises acting as a break in your routine that you can look forward to.   Perform all exercises about fifteen times, three times per day or as directed.  You should exercise both the operative leg and the other leg as well.  Exercises include:   Quad Sets - Tighten up the muscle on the front of the thigh (Quad) and hold for 5-10 seconds.   Straight Leg Raises - With your knee straight (if you were given a brace, keep it on), lift the leg to 60 degrees, hold  for 3 seconds, and slowly lower the leg.  Perform this exercise against resistance later as your leg gets stronger.  Leg Slides: Lying on your back, slowly slide your foot toward your buttocks, bending your knee up off the floor (only go as far as is comfortable). Then slowly slide your foot back down until your leg is flat on the floor again.  Angel Wings: Lying on your back spread your legs to the side as far apart as you can without causing discomfort.  Hamstring Strength:  Lying on your back, push your heel against the floor with your leg straight by tightening up the muscles of your buttocks.  Repeat, but this time bend your knee to a comfortable angle, and push your heel against the floor.  You may put a pillow under the heel to make it more comfortable if necessary.   A rehabilitation program following joint replacement surgery can speed recovery and prevent re-injury in the future due to weakened muscles. Contact your doctor or a physical therapist for more information on knee rehabilitation.   CONSTIPATION:  Constipation is defined medically as fewer than three stools per week and severe constipation as less than one stool per week.  Even if you have a regular bowel pattern at home, your normal regimen is likely to be disrupted due to multiple reasons following surgery.  Combination of anesthesia, postoperative narcotics, change in appetite and fluid intake all can affect your bowels.   YOU MUST use at least one of the following options; they are listed in order of increasing strength to get the job done.  They are all available over the counter, and you may need to use some, POSSIBLY even all of these options:    Drink plenty of fluids (prune juice may be helpful) and high fiber foods Colace 100 mg by mouth twice a day  Senokot for constipation as directed and as needed Dulcolax (bisacodyl), take with full glass of water  Miralax (polyethylene glycol) once or twice a day as needed.  If you  have tried all these things and are unable to have a bowel movement in the first 3-4 days after surgery call either your surgeon or your primary doctor.    If you experience loose stools or diarrhea, hold the medications until you stool forms back up.  If your symptoms do not get better within 1 week or if they get worse, check with your doctor.  If you experience "the worst abdominal pain ever" or develop nausea or vomiting, please contact the office immediately for further recommendations for treatment.  ITCHING:  If you experience itching with your medications, try taking only a single pain pill, or even half a pain pill at a time.  You can also use Benadryl over the counter for itching or also to help with sleep.   TED HOSE STOCKINGS:  Use stockings on both legs until for at least 2 weeks or  as directed by physician office. They may be removed at night for sleeping.  MEDICATIONS:  See your medication summary on the "After Visit Summary" that nursing will review with you.  You may have some home medications which will be placed on hold until you complete the course of blood thinner medication.  It is important for you to complete the blood thinner medication as prescribed.  Blood clot prevention (DVT Prophylaxis): After surgery you are at an increased risk for a blood clot. You were prescribed a blood thinner, Eliquis 2.5mg , to be taken twice daily for a total of 4 weeks from surgery to help reduce your risk of getting a blood clot.  Signs of a pulmonary embolus (blood clot in the lungs) include sudden short of breath, feeling lightheaded or dizzy, chest pain with a deep breath, rapid pulse rapid breathing.  Signs of a blood clot in your arms or legs include new unexplained swelling and cramping, warm, red or darkened skin around the painful area.  Please call the office or 911 right away if these signs or symptoms develop.  PRECAUTIONS:   If you experience chest pain or shortness of breath - call  911 immediately for transfer to the hospital emergency department.   If you develop a fever greater that 101 F, purulent drainage from wound, increased redness or drainage from wound, foul odor from the wound/dressing, or calf pain - CONTACT YOUR SURGEON.                                                   FOLLOW-UP APPOINTMENTS:  If you do not already have a post-op appointment, please call the office for an appointment to be seen by your surgeon.  Guidelines for how soon to be seen are listed in your "After Visit Summary", but are typically between 2-3 weeks after surgery.  If you have a specialized bandage, you may be told to follow up 1 week after surgery.  POST-OPERATIVE OPIOID TAPER INSTRUCTIONS: It is important to wean off of your opioid medication as soon as possible. If you do not need pain medication after your surgery it is ok to stop day one. Opioids include: Codeine, Hydrocodone(Norco, Vicodin), Oxycodone(Percocet, oxycontin) and hydromorphone amongst others.  Long term and even short term use of opiods can cause: Increased pain response Dependence Constipation Depression Respiratory depression And more.  Withdrawal symptoms can include Flu like symptoms Nausea, vomiting And more Techniques to manage these symptoms Hydrate well Eat regular healthy meals Stay active Use relaxation techniques(deep breathing, meditating, yoga) Do Not substitute Alcohol to help with tapering If you have been on opioids for less than two weeks and do not have pain than it is ok to stop all together.  Plan to wean off of opioids This plan should start within one week post op of your joint replacement. Maintain the same interval or time between taking each dose and first decrease the dose.  Cut the total daily intake of opioids by one tablet each day Next start to increase the time between doses. The last dose that should be eliminated is the evening dose.   MAKE SURE YOU:  Understand these  instructions.  Get help right away if you are not doing well or get worse.    Thank you for letting us be a part of your medical care team.  It is a privilege we respect greatly.  We hope these instructions will help you stay on track for a fast and full recovery!

## 2023-05-19 NOTE — Anesthesia Procedure Notes (Signed)
 Spinal  Patient location during procedure: OR Start time: 05/19/2023 10:46 AM End time: 05/19/2023 10:54 AM Reason for block: surgical anesthesia Staffing Performed: anesthesiologist  Anesthesiologist: Beryle Lathe, MD Performed by: Beryle Lathe, MD Authorized by: Beryle Lathe, MD   Preanesthetic Checklist Completed: patient identified, IV checked, risks and benefits discussed, surgical consent, monitors and equipment checked, pre-op evaluation and timeout performed Spinal Block Patient position: sitting Prep: DuraPrep Patient monitoring: heart rate, cardiac monitor, continuous pulse ox and blood pressure Approach: midline Location: L2-3 Injection technique: single-shot Needle Needle type: Quincke  Needle gauge: 22 G Needle length: 12.7 cm Assessment Events: second provider Additional Notes Consent was obtained prior to the procedure with all questions answered and concerns addressed. Risks including, but not limited to, bleeding, infection, nerve damage, paralysis, failed block, inadequate analgesia, allergic reaction, high spinal, itching, and headache were discussed and the patient wished to proceed. Functioning IV was confirmed and monitors were applied. Sterile prep and drape, including hand hygiene, mask, and sterile gloves were used. The patient was positioned and the spine was prepped. The skin was anesthetized with lidocaine. After failed attempt by CRNA, Dr. Mal Amabile able to locate intrathecal space. Free flow of clear CSF was obtained prior to injecting local anesthetic into the CSF. The spinal needle aspirated freely following injection. The needle was carefully withdrawn. The patient tolerated the procedure well.   Leslye Peer, MD

## 2023-05-19 NOTE — Anesthesia Preprocedure Evaluation (Addendum)
 Anesthesia Evaluation  Patient identified by MRN, date of birth, ID band Patient awake    Reviewed: Allergy & Precautions, NPO status , Patient's Chart, lab work & pertinent test results  History of Anesthesia Complications Negative for: history of anesthetic complications  Airway Mallampati: II  TM Distance: >3 FB Neck ROM: Full    Dental  (+) Dental Advisory Given, Teeth Intact   Pulmonary former smoker   Pulmonary exam normal        Cardiovascular negative cardio ROS Normal cardiovascular exam     Neuro/Psych negative neurological ROS  negative psych ROS   GI/Hepatic negative GI ROS, Neg liver ROS,,,  Endo/Other   Obesity   Renal/GU negative Renal ROS     Musculoskeletal  (+) Arthritis ,    Abdominal  (+) + obese  Peds  Hematology  Plt 254k    Anesthesia Other Findings   Reproductive/Obstetrics  s/p tubal ligation                              Anesthesia Physical Anesthesia Plan  ASA: 2  Anesthesia Plan: Spinal   Post-op Pain Management: Tylenol PO (pre-op)*   Induction:   PONV Risk Score and Plan: 2 and Treatment may vary due to age or medical condition and Propofol infusion  Airway Management Planned: Natural Airway and Simple Face Mask  Additional Equipment: None  Intra-op Plan:   Post-operative Plan:   Informed Consent: I have reviewed the patients History and Physical, chart, labs and discussed the procedure including the risks, benefits and alternatives for the proposed anesthesia with the patient or authorized representative who has indicated his/her understanding and acceptance.       Plan Discussed with: CRNA and Anesthesiologist  Anesthesia Plan Comments: (Labs reviewed, platelets acceptable. Discussed risks and benefits of spinal, including spinal/epidural hematoma, infection, failed block, and PDPH. Patient expressed understanding and wished to  proceed. )        Anesthesia Quick Evaluation

## 2023-05-19 NOTE — Evaluation (Signed)
 Physical Therapy Evaluation Patient Details Name: Dana Cole MRN: [id number] DOB: 10/09/1980 Today's Date: 05/19/2023  History of Present Illness  43 yo female presents to therapy s/p conversion from R SCFE screw placement to R THA, posterior lateral approach on 05/19/2023 due to failure of conservative measures. Pt is currently R LE WBAT with posterior hip precautions x 6 wks. Pt PMH includes but is not limited to: leukocytosis, trichomonas infection, joint replacement and hip surgery.  Clinical Impression      Dana Cole is a 43 y.o. female POD 0 s/p R THA conversion. Patient reports IND with mobility at baseline. Patient is now limited by functional impairments (see PT problem list below) and requires CGA and cues for transfers and gait with RW. Patient was able to ambulate 40 and 25 feet with RW and CGA and cues for safe walker management. Patient educated on safe sequencing for stair mobility with RW, fall risk prevention, pain management and goal, use of CP/ice and car transfers pt verbalized understanding of safe guarding position for people assisting with mobility. Pt friend present for a portion of evaluation.  Patient instructed in exercises to facilitate ROM and circulation reviewed and HO provided. PT reviewed and provided HO for posterior hip precautions. Patient will benefit from continued skilled PT interventions to address impairments and progress towards PLOF. Patient has met mobility goals at adequate level for discharge home with family and social support with OPPT services; will continue to follow if pt continues acute stay to progress towards Mod I goals.     If plan is discharge home, recommend the following: A little help with walking and/or transfers;A little help with bathing/dressing/bathroom;Assistance with cooking/housework;Assist for transportation;Help with stairs or ramp for entrance   Can travel by private vehicle        Equipment Recommendations Rolling  walker (2 wheels)  Recommendations for Other Services       Functional Status Assessment Patient has had a recent decline in their functional status and demonstrates the ability to make significant improvements in function in a reasonable and predictable amount of time.     Precautions / Restrictions Precautions Precautions: Posterior Hip;Fall Restrictions Weight Bearing Restrictions Per Provider Order: No      Mobility  Bed Mobility Overal bed mobility: Needs Assistance Bed Mobility: Supine to Sit     Supine to sit: Contact guard, HOB elevated, Used rails     General bed mobility comments: increased time and cues    Transfers Overall transfer level: Needs assistance Equipment used: Rolling walker (2 wheels) Transfers: Sit to/from Stand Sit to Stand: Contact guard assist           General transfer comment: cues for safety and technique, placement of RW and UE for improved eccentric contoll to sitting surface as well as hip flexion bed, commode adn recliner x 2 increased time    Ambulation/Gait Ambulation/Gait assistance: Contact guard assist Gait Distance (Feet): 40 Feet Assistive device: Rolling walker (2 wheels) Gait Pattern/deviations: Step-to pattern, Antalgic, Trunk flexed Gait velocity: decreased     General Gait Details: B UE support at RW to offload R LE in stance phase with cues for posture, proper distance from RW, frequent standing rest breaks with trunk flexion and forearms on Rw  Stairs Stairs: Yes Stairs assistance: Contact guard assist Stair Management: Two rails Number of Stairs: 3 General stair comments: cue for step to pattern and safety as well as R knee flexion for improved foot clearnace on step, pt instruction  provided on use of RW to navigate one small step to access apartment  Wheelchair Mobility     Tilt Bed    Modified Rankin (Stroke Patients Only)       Balance Overall balance assessment: Needs assistance Sitting-balance  support: Feet supported Sitting balance-Leahy Scale: Good     Standing balance support: Bilateral upper extremity supported, During functional activity, Reliant on assistive device for balance Standing balance-Leahy Scale: Poor                               Pertinent Vitals/Pain Pain Assessment Pain Assessment: 0-10 Pain Score: 8  Pain Location: R hip and LE Pain Descriptors / Indicators: Aching, Constant, Discomfort, Dull, Operative site guarding, Grimacing Pain Intervention(s): Limited activity within patient's tolerance, Monitored during session, Premedicated before session, Repositioned, Ice applied    Home Living Family/patient expects to be discharged to:: Private residence Living Arrangements: Children;Parent (sister) Available Help at Discharge: Family Type of Home: Apartment Home Access:  (small curb)       Home Layout: One level Home Equipment: BSC/3in1      Prior Function Prior Level of Function : Independent/Modified Independent;Working/employed             Mobility Comments: IND with all ADLs, self care tasks, IADLs       Extremity/Trunk Assessment        Lower Extremity Assessment Lower Extremity Assessment: RLE deficits/detail RLE Deficits / Details: ankle DF/PF 5/5 RLE Sensation: WNL    Cervical / Trunk Assessment Cervical / Trunk Assessment: Normal  Communication   Communication Communication: No apparent difficulties    Cognition Arousal: Alert Behavior During Therapy: WFL for tasks assessed/performed   PT - Cognitive impairments: No apparent impairments                         Following commands: Intact       Cueing       General Comments      Exercises Total Joint Exercises Ankle Circles/Pumps: AROM, Both, 10 reps Quad Sets: AROM, Right, 5 reps Heel Slides: AROM, Right, 5 reps Hip ABduction/ADduction: AROM, Right, 5 reps, Standing Long Arc Quad: AROM, Right, 5 reps, Seated Knee Flexion: AROM,  Right, 5 reps, Standing Standing Hip Extension: AROM, Right, 5 reps, Standing   Assessment/Plan    PT Assessment Patient needs continued PT services  PT Problem List Decreased strength;Decreased range of motion;Decreased activity tolerance;Decreased balance;Decreased mobility;Decreased coordination;Pain       PT Treatment Interventions DME instruction;Gait training;Stair training;Functional mobility training;Therapeutic activities;Therapeutic exercise;Balance training;Neuromuscular re-education;Patient/family education;Modalities    PT Goals (Current goals can be found in the Care Plan section)  Acute Rehab PT Goals Patient Stated Goal: walk more normally no pain PT Goal Formulation: With patient Time For Goal Achievement: 06/02/23 Potential to Achieve Goals: Good    Frequency 7X/week     Co-evaluation               AM-PAC PT "6 Clicks" Mobility  Outcome Measure Help needed turning from your back to your side while in a flat bed without using bedrails?: None Help needed moving from lying on your back to sitting on the side of a flat bed without using bedrails?: A Little Help needed moving to and from a bed to a chair (including a wheelchair)?: A Little Help needed standing up from a chair using your arms (e.g., wheelchair or bedside chair)?: A  Little Help needed to walk in hospital room?: A Little Help needed climbing 3-5 steps with a railing? : A Little 6 Click Score: 19    End of Session Equipment Utilized During Treatment: Gait belt Activity Tolerance: Patient tolerated treatment well;No increased pain Patient left: in chair;with call bell/phone within reach;with nursing/sitter in room Nurse Communication: Mobility status;Other (comment) (pt readiness for d/c from PT standpoint) PT Visit Diagnosis: Unsteadiness on feet (R26.81);Other abnormalities of gait and mobility (R26.89);Muscle weakness (generalized) (M62.81);Difficulty in walking, not elsewhere classified  (R26.2);Pain    Time: 1751-1849 PT Time Calculation (min) (ACUTE ONLY): 58 min   Charges:   PT Evaluation $PT Eval Low Complexity: 1 Low PT Treatments $Gait Training: 8-22 mins $Therapeutic Exercise: 8-22 mins $Therapeutic Activity: 8-22 mins PT General Charges $$ ACUTE PT VISIT: 1 Visit         Johnny Bridge, PT Acute Rehab   Jacqualyn Posey 05/19/2023, 7:05 PM

## 2023-05-19 NOTE — Transfer of Care (Signed)
 Immediate Anesthesia Transfer of Care Note  Patient: Dana Cole  Procedure(s) Performed: TOTAL HIP ARTHROPLASTY (Right: Hip)  Patient Location: PACU  Anesthesia Type:Spinal  Level of Consciousness: awake, alert , oriented, and patient cooperative  Airway & Oxygen Therapy: Patient Spontanous Breathing and Patient connected to face mask oxygen  Post-op Assessment: Report given to RN and Post -op Vital signs reviewed and stable  Post vital signs: Reviewed and stable  Last Vitals:  Vitals Value Taken Time  BP 135/82 05/19/23 1343  Temp    Pulse 57 05/19/23 1345  Resp 15 05/19/23 1345  SpO2 100 % 05/19/23 1345  Vitals shown include unfiled device data.  Last Pain:  Vitals:   05/19/23 0744  TempSrc:   PainSc: 0-No pain         Complications: No notable events documented.

## 2023-05-19 NOTE — Interval H&P Note (Signed)
 The patient has been re-examined, and the chart reviewed, and there have been no interval changes to the documented history and physical.    Plan for R hip conversion to THA  The operative side was examined and the patient was confirmed to have sensation to DPN, SPN, TN intact, Motor EHL, ext, flex 5/5, and DP 2+, PT 2+, No significant edema.   The risks, benefits, and alternatives have been discussed at length with patient, and the patient is willing to proceed.  Right hip marked. Consent has been signed.

## 2023-05-20 ENCOUNTER — Encounter (HOSPITAL_COMMUNITY): Payer: Self-pay | Admitting: Orthopedic Surgery

## 2023-08-03 ENCOUNTER — Encounter (HOSPITAL_COMMUNITY): Payer: Self-pay

## 2023-08-03 ENCOUNTER — Ambulatory Visit (HOSPITAL_COMMUNITY)
Admission: EM | Admit: 2023-08-03 | Discharge: 2023-08-03 | Disposition: A | Discharge status: Home / Self Care | Attending: Family Medicine | Admitting: Family Medicine

## 2023-08-03 DIAGNOSIS — K047 Periapical abscess without sinus: Secondary | ICD-10-CM

## 2023-08-03 MED ORDER — KETOROLAC TROMETHAMINE 30 MG/ML IJ SOLN
30.0000 mg | Freq: Once | INTRAMUSCULAR | Status: AC
  Administered 2023-08-03: 30 mg via INTRAMUSCULAR

## 2023-08-03 MED ORDER — KETOROLAC TROMETHAMINE 10 MG PO TABS: 10.0000 mg | ORAL_TABLET | Freq: Four times a day (QID) | ORAL | 0 refills | Status: AC | PRN

## 2023-08-03 MED ORDER — AMOXICILLIN-POT CLAVULANATE 875-125 MG PO TABS: 1.0000 | ORAL_TABLET | Freq: Two times a day (BID) | ORAL | 0 refills | Status: AC

## 2023-08-03 MED ORDER — KETOROLAC TROMETHAMINE 30 MG/ML IJ SOLN
INTRAMUSCULAR | Status: AC
  Filled 2023-08-03: qty 1

## 2023-08-03 NOTE — ED Provider Notes (Signed)
 [facility]    CSN: [id number] Arrival date & time: 08/03/23  1349      History   Chief Complaint Chief Complaint  Patient presents with   Dental Pain    HPI Dana Cole is a 43 y.o. female.    Dental Pain Here for pain around her posterior upper teeth on the right.  It began bothering her about 3 to 4 days ago and then got worse yesterday.  No fever or chills.  She does feel like the roof of her mouth is swollen some.  NKDA  Last menstrual cycle was May 13 until now.  She is taken some Goody powders and then some rinses.  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           Home Medications    Prior to Admission medications   Medication Sig Start Date End Date Taking? Authorizing Provider  amoxicillin -clavulanate (AUGMENTIN ) 875-125 MG tablet Take 1 tablet by mouth 2 (two) times daily for 7 days. 08/03/23 08/10/23 Yes Ann Keto, MD  ketorolac  (TORADOL ) 10 MG tablet Take 1 tablet (10 mg total) by mouth every 6 (six) hours as needed (pain). 08/03/23  Yes Ann Keto, MD  methocarbamol   (ROBAXIN ) 500 MG tablet Take 500 mg by mouth 4 (four) times daily.   Yes [provider]  traMADol (ULTRAM) 50 MG tablet Take by mouth every 6 (six) hours as needed.   Yes [provider]  Aspirin-Acetaminophen -Caffeine 520-260-32.5 MG PACK Take 1 packet by mouth.    [provider]    Family History Family History  Problem Relation Age of Onset   Hypertension Other    Diabetes Other     Social History Social History   Tobacco Use   Smoking status: Former    Current packs/day: 0.00    Types: Cigarettes    Quit date: 06/07/2015    Years since quitting: 8.1   Smokeless tobacco: Former  Building services engineer status: Never Used  Substance Use Topics   Alcohol  use: Not Currently    Comment: occassional    Drug use: Not Currently    Types: Marijuana    Comment: last marijuana 10/2015     Allergies   Shellfish allergy   Review of Systems Review of Systems  Physical Exam Triage Vital Signs ED Triage Vitals  Encounter Vitals Group     BP 08/03/23 1418 124/78     Systolic BP Percentile --      Diastolic BP Percentile --      Pulse Rate 08/03/23 1418 83     Resp 08/03/23 1418 16     Temp 08/03/23 1418 98.9 F (37.2 C)     Temp Source 08/03/23 1418 Oral     SpO2 08/03/23 1418 100 %     Weight 08/03/23 1414 276 lb (125.2 kg)     Height 08/03/23 1414 5\' 9"  (1.753 m)     Head Circumference --      Peak Flow --      Pain Score 08/03/23 1413 10     Pain Loc --      Pain Education --      Exclude from Growth Chart --    No data found.  Updated Vital Signs BP 124/78 (BP Location: Right Arm)   Pulse 83   Temp 98.9 F (37.2 C) (Oral)   Resp 16   Ht 5\' 9"  (1.753 m)   Wt 125.2 kg   LMP 07/29/2023 (Approximate)   SpO2 100%   BMI 40.76 kg/m   Visual Acuity Right Eye Distance:   Left Eye Distance:   Bilateral Distance:    Right Eye Near:   Left Eye Near:    Bilateral Near:     Physical Exam Vitals reviewed.  Constitutional:       General: She is not in acute distress.    Appearance: She is not ill-appearing, toxic-appearing or diaphoretic.  HENT:     Mouth/Throat:     Mouth: Mucous membranes are moist.     Pharynx: No oropharyngeal exudate or posterior oropharyngeal erythema.     Comments: There is some swelling of the palate near the last upper molar on the right. Skin:    Coloration: Skin is not pale.  Neurological:     Mental Status: She is alert and oriented to person, place, and time.  Psychiatric:        Behavior: Behavior normal.      UC Treatments / Results  Labs (all labs ordered are listed, but only abnormal results are displayed) Labs Reviewed - No data to display  EKG   Radiology No results found.  Procedures Procedures (including critical care time)  Medications Ordered in UC Medications  ketorolac  (TORADOL ) 30 MG/ML injection 30 mg (has no administration in time range)    Initial Impression / Assessment and Plan / UC Course  I have reviewed the triage vital signs and the nursing notes.  Pertinent labs & imaging results that were available during my care of the patient were reviewed by me and considered in my medical decision making (see chart for details).     Toradol  injection is given here and Toradol  tablets are sent to the pharmacy for pain.  Augmentin  is sent in for the infection.  She is given information on low-cost dental providers, but she will also seek dental care through her dental insurance. Final Clinical Impressions(s) / UC Diagnoses   Final diagnoses:  Dental infection     Discharge Instructions      You have been given a shot of Toradol  30 mg today.  Ketorolac  10 mg tablets--take 1 tablet every 6 hours as needed for pain.  This is the same medicine that is in the shot we just gave you  Take amoxicillin -clavulanate 875  mg--1 tab twice daily with food for 7 days  Do not take the celecoxib /Celebrex  with the ketorolac .     ED Prescriptions      Medication Sig Dispense Auth. Provider   ketorolac  (TORADOL ) 10 MG tablet Take 1 tablet (10 mg total) by mouth every 6 (six) hours as needed (pain). 20 tablet Jarion Hawthorne K, MD   amoxicillin -clavulanate (AUGMENTIN ) 875-125 MG tablet Take 1 tablet by mouth 2 (two) times daily for 7 days. 14 tablet Hula Tasso K, MD      PDMP not reviewed this encounter.   Ann Keto, MD 08/03/23 1455

## 2023-08-03 NOTE — ED Triage Notes (Signed)
 Patient here today with c/o right upper dental pain X 3-4 days.

## 2023-08-03 NOTE — Discharge Instructions (Signed)
 You have been given a shot of Toradol  30 mg today.  Ketorolac  10 mg tablets--take 1 tablet every 6 hours as needed for pain.  This is the same medicine that is in the shot we just gave you  Take amoxicillin -clavulanate 875 mg--1 tab twice daily with food for 7 days  Do not take the celecoxib /Celebrex  with the ketorolac .
